# Patient Record
Sex: Male | Born: 1990 | Race: White | Hispanic: No | Marital: Single | Smoking: Current every day smoker
Health system: Southern US, Community
[De-identification: ages and names within clinical notes are randomized; demographics above are authoritative.]

## PROBLEM LIST (undated history)

## (undated) DIAGNOSIS — Z5189 Encounter for other specified aftercare: Secondary | ICD-10-CM

## (undated) DIAGNOSIS — D689 Coagulation defect, unspecified: Secondary | ICD-10-CM

## (undated) DIAGNOSIS — F419 Anxiety disorder, unspecified: Secondary | ICD-10-CM

## (undated) DIAGNOSIS — F32A Depression, unspecified: Secondary | ICD-10-CM

## (undated) DIAGNOSIS — F329 Major depressive disorder, single episode, unspecified: Secondary | ICD-10-CM

## (undated) DIAGNOSIS — W3400XA Accidental discharge from unspecified firearms or gun, initial encounter: Secondary | ICD-10-CM

## (undated) DIAGNOSIS — R109 Unspecified abdominal pain: Secondary | ICD-10-CM

## (undated) DIAGNOSIS — R634 Abnormal weight loss: Secondary | ICD-10-CM

## (undated) DIAGNOSIS — F913 Oppositional defiant disorder: Secondary | ICD-10-CM

## (undated) DIAGNOSIS — M79606 Pain in leg, unspecified: Secondary | ICD-10-CM

## (undated) HISTORY — DX: Unspecified abdominal pain: R10.9

## (undated) HISTORY — DX: Coagulation defect, unspecified: D68.9

## (undated) HISTORY — PX: HERNIA REPAIR: SHX51

## (undated) HISTORY — DX: Abnormal weight loss: R63.4

## (undated) HISTORY — DX: Pain in leg, unspecified: M79.606

## (undated) HISTORY — PX: COLOSTOMY REVERSAL: SHX5782

## (undated) HISTORY — PX: ABDOMINAL SURGERY: SHX537

## (undated) HISTORY — PX: COLOSTOMY: SHX63

## (undated) HISTORY — DX: Encounter for other specified aftercare: Z51.89

---
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
## 2011-04-13 LAB — CBC
MCHC: 35.5
MCV: 90
Platelets: 234
RDW: 13.9

## 2011-04-13 LAB — COMPREHENSIVE METABOLIC PANEL
AST: 34
Albumin: 4.7
CO2: 25
Calcium: 9.3
Creatinine, Ser: 0.97
GFR calc Af Amer: >60
GFR calc non Af Amer: >60
Total Protein: 7.1

## 2011-04-13 LAB — LIPASE, BLOOD: Lipase: 14

## 2011-04-13 LAB — DIFFERENTIAL
Eosinophils Relative: 0
Lymphocytes Relative: 9 — ABNORMAL LOW
Lymphs Abs: 0.9
Monocytes Relative: 5

## 2012-03-17 DIAGNOSIS — W3400XA Accidental discharge from unspecified firearms or gun, initial encounter: Secondary | ICD-10-CM

## 2012-03-17 HISTORY — DX: Accidental discharge from unspecified firearms or gun, initial encounter: W34.00XA

## 2012-03-22 ENCOUNTER — Encounter (HOSPITAL_COMMUNITY): Payer: Self-pay | Admitting: Certified Registered Nurse Anesthetist

## 2012-03-22 ENCOUNTER — Inpatient Hospital Stay (HOSPITAL_COMMUNITY): Payer: BC Managed Care – PPO

## 2012-03-22 ENCOUNTER — Emergency Department (HOSPITAL_COMMUNITY): Payer: BC Managed Care – PPO

## 2012-03-22 ENCOUNTER — Inpatient Hospital Stay (HOSPITAL_COMMUNITY)
Admission: EM | Admit: 2012-03-22 | Discharge: 2012-03-26 | DRG: 585 | Payer: BC Managed Care – PPO | Attending: General Surgery | Admitting: General Surgery

## 2012-03-22 ENCOUNTER — Encounter (HOSPITAL_COMMUNITY)
Admission: EM | Disposition: A | Discharge status: Other Acute Inpatient Hospital | Payer: Self-pay | Source: Home / Self Care

## 2012-03-22 ENCOUNTER — Emergency Department (HOSPITAL_COMMUNITY): Payer: BC Managed Care – PPO | Admitting: Certified Registered Nurse Anesthetist

## 2012-03-22 DIAGNOSIS — S36499A Other injury of unspecified part of small intestine, initial encounter: Secondary | ICD-10-CM | POA: Diagnosis present

## 2012-03-22 DIAGNOSIS — Y230XXA Shotgun discharge, undetermined intent, initial encounter: Secondary | ICD-10-CM

## 2012-03-22 DIAGNOSIS — R571 Hypovolemic shock: Secondary | ICD-10-CM | POA: Diagnosis not present

## 2012-03-22 DIAGNOSIS — S35513A Injury of unspecified iliac artery, initial encounter: Secondary | ICD-10-CM

## 2012-03-22 DIAGNOSIS — J9600 Acute respiratory failure, unspecified whether with hypoxia or hypercapnia: Secondary | ICD-10-CM | POA: Diagnosis present

## 2012-03-22 DIAGNOSIS — R509 Fever, unspecified: Secondary | ICD-10-CM | POA: Diagnosis not present

## 2012-03-22 DIAGNOSIS — S36509A Unspecified injury of unspecified part of colon, initial encounter: Secondary | ICD-10-CM

## 2012-03-22 DIAGNOSIS — Z885 Allergy status to narcotic agent status: Secondary | ICD-10-CM

## 2012-03-22 DIAGNOSIS — D684 Acquired coagulation factor deficiency: Secondary | ICD-10-CM | POA: Diagnosis present

## 2012-03-22 DIAGNOSIS — S35516A Injury of unspecified iliac vein, initial encounter: Secondary | ICD-10-CM

## 2012-03-22 DIAGNOSIS — D689 Coagulation defect, unspecified: Secondary | ICD-10-CM | POA: Diagnosis not present

## 2012-03-22 DIAGNOSIS — S36408A Unspecified injury of other part of small intestine, initial encounter: Secondary | ICD-10-CM | POA: Diagnosis present

## 2012-03-22 DIAGNOSIS — Y92009 Unspecified place in unspecified non-institutional (private) residence as the place of occurrence of the external cause: Secondary | ICD-10-CM

## 2012-03-22 DIAGNOSIS — S71109A Unspecified open wound, unspecified thigh, initial encounter: Secondary | ICD-10-CM | POA: Diagnosis present

## 2012-03-22 DIAGNOSIS — S71009A Unspecified open wound, unspecified hip, initial encounter: Secondary | ICD-10-CM | POA: Diagnosis present

## 2012-03-22 DIAGNOSIS — S36409A Unspecified injury of unspecified part of small intestine, initial encounter: Secondary | ICD-10-CM

## 2012-03-22 DIAGNOSIS — S3130XA Unspecified open wound of scrotum and testes, initial encounter: Secondary | ICD-10-CM | POA: Diagnosis present

## 2012-03-22 DIAGNOSIS — S31139A Puncture wound of abdominal wall without foreign body, unspecified quadrant without penetration into peritoneal cavity, initial encounter: Secondary | ICD-10-CM

## 2012-03-22 DIAGNOSIS — T85898A Other specified complication of other internal prosthetic devices, implants and grafts, initial encounter: Secondary | ICD-10-CM | POA: Diagnosis not present

## 2012-03-22 DIAGNOSIS — D696 Thrombocytopenia, unspecified: Secondary | ICD-10-CM | POA: Diagnosis not present

## 2012-03-22 DIAGNOSIS — T794XXA Traumatic shock, initial encounter: Secondary | ICD-10-CM | POA: Diagnosis present

## 2012-03-22 DIAGNOSIS — S31109A Unspecified open wound of abdominal wall, unspecified quadrant without penetration into peritoneal cavity, initial encounter: Secondary | ICD-10-CM

## 2012-03-22 DIAGNOSIS — S61509A Unspecified open wound of unspecified wrist, initial encounter: Secondary | ICD-10-CM | POA: Diagnosis present

## 2012-03-22 DIAGNOSIS — Z781 Physical restraint status: Secondary | ICD-10-CM | POA: Diagnosis not present

## 2012-03-22 DIAGNOSIS — S6992XA Unspecified injury of left wrist, hand and finger(s), initial encounter: Secondary | ICD-10-CM | POA: Diagnosis present

## 2012-03-22 DIAGNOSIS — D62 Acute posthemorrhagic anemia: Secondary | ICD-10-CM | POA: Diagnosis present

## 2012-03-22 DIAGNOSIS — S36899A Unspecified injury of other intra-abdominal organs, initial encounter: Principal | ICD-10-CM | POA: Diagnosis present

## 2012-03-22 DIAGNOSIS — Y833 Surgical operation with formation of external stoma as the cause of abnormal reaction of the patient, or of later complication, without mention of misadventure at the time of the procedure: Secondary | ICD-10-CM | POA: Diagnosis not present

## 2012-03-22 HISTORY — PX: APPLICATION OF WOUND VAC: SHX5189

## 2012-03-22 HISTORY — PX: BOWEL RESECTION: SHX1257

## 2012-03-22 HISTORY — PX: AORTA - BILATERAL FEMORAL ARTERY BYPASS GRAFT: SHX1175

## 2012-03-22 HISTORY — PX: LAPAROTOMY: SHX154

## 2012-03-22 HISTORY — PX: PARTIAL COLECTOMY: SHX5273

## 2012-03-22 LAB — POCT I-STAT 4, (NA,K, GLUC, HGB,HCT)
Hemoglobin: 10.5 g/dL — ABNORMAL LOW (ref 13.0–17.0)
Potassium: 4.4 mEq/L (ref 3.5–5.1)
Potassium: 3.8 mEq/L (ref 3.5–5.1)
Sodium: 144 mEq/L (ref 135–145)
Sodium: 144 mEq/L (ref 135–145)

## 2012-03-22 LAB — POCT I-STAT 3, ART BLOOD GAS (G3+)
Patient temperature: 98.6
pH, Arterial: 7.317 — ABNORMAL LOW (ref 7.350–7.450)

## 2012-03-22 LAB — POCT I-STAT, CHEM 8
Chloride: 107 mEq/L (ref 96–112)
Glucose, Bld: 273 mg/dL — ABNORMAL HIGH (ref 70–99)
HCT: 37 % — ABNORMAL LOW (ref 39.0–52.0)
Potassium: 3.1 mEq/L — ABNORMAL LOW (ref 3.5–5.1)

## 2012-03-22 LAB — URINALYSIS, MICROSCOPIC ONLY
Bilirubin Urine: NEGATIVE
Ketones, ur: NEGATIVE mg/dL
Nitrite: NEGATIVE
Protein, ur: 30 mg/dL — AB
Urobilinogen, UA: 0.2 mg/dL (ref 0.0–1.0)

## 2012-03-22 LAB — CBC
HCT: 35 % — ABNORMAL LOW (ref 39.0–52.0)
HCT: 34 % — ABNORMAL LOW (ref 39.0–52.0)
HCT: 27.2 % — ABNORMAL LOW (ref 39.0–52.0)
HCT: 23.4 % — ABNORMAL LOW (ref 39.0–52.0)
Hemoglobin: 11.9 g/dL — ABNORMAL LOW (ref 13.0–17.0)
Hemoglobin: 9.7 g/dL — ABNORMAL LOW (ref 13.0–17.0)
Hemoglobin: 8.4 g/dL — ABNORMAL LOW (ref 13.0–17.0)
MCH: 30.2 pg (ref 26.0–34.0)
MCH: 31 pg (ref 26.0–34.0)
MCHC: 35 g/dL (ref 30.0–36.0)
MCHC: 35.7 g/dL (ref 30.0–36.0)
MCV: 91.9 fL (ref 78.0–100.0)
RBC: 3.16 MIL/uL — ABNORMAL LOW (ref 4.22–5.81)
RBC: 2.71 MIL/uL — ABNORMAL LOW (ref 4.22–5.81)
RDW: 13.4 % (ref 11.5–15.5)
RDW: 14.4 % (ref 11.5–15.5)
WBC: 19 10*3/uL — ABNORMAL HIGH (ref 4.0–10.5)
WBC: 4.1 10*3/uL (ref 4.0–10.5)

## 2012-03-22 LAB — COMPREHENSIVE METABOLIC PANEL
ALT: 21 U/L (ref 0–53)
AST: 32 U/L (ref 0–37)
Albumin: 2.7 g/dL — ABNORMAL LOW (ref 3.5–5.2)
Albumin: 3 g/dL — ABNORMAL LOW (ref 3.5–5.2)
Alkaline Phosphatase: 46 U/L (ref 39–117)
Alkaline Phosphatase: 46 U/L (ref 39–117)
BUN: 15 mg/dL (ref 6–23)
BUN: 17 mg/dL (ref 6–23)
Calcium: 7.5 mg/dL — ABNORMAL LOW (ref 8.4–10.5)
Creatinine, Ser: 0.88 mg/dL (ref 0.50–1.35)
GFR calc Af Amer: >90 mL/min (ref >90)
Glucose, Bld: 118 mg/dL — ABNORMAL HIGH (ref 70–99)
Potassium: 3.7 mEq/L (ref 3.5–5.1)
Sodium: 139 mEq/L (ref 135–145)
Total Protein: 4.5 g/dL — ABNORMAL LOW (ref 6.0–8.3)
Total Protein: 4.7 g/dL — ABNORMAL LOW (ref 6.0–8.3)

## 2012-03-22 LAB — BASIC METABOLIC PANEL
CO2: 26 mEq/L (ref 19–32)
Chloride: 109 mEq/L (ref 96–112)
GFR calc non Af Amer: >90 mL/min (ref >90)
Glucose, Bld: 90 mg/dL (ref 70–99)
Potassium: 3.6 mEq/L (ref 3.5–5.1)
Sodium: 143 mEq/L (ref 135–145)

## 2012-03-22 LAB — PROTIME-INR
INR: 1.47 (ref 0.00–1.49)
INR: 1.49 (ref 0.00–1.49)

## 2012-03-22 LAB — MRSA PCR SCREENING: MRSA by PCR: NEGATIVE

## 2012-03-22 LAB — ABO/RH: ABO/RH(D): O POS

## 2012-03-22 LAB — POCT I-STAT TROPONIN I

## 2012-03-22 SURGERY — LAPAROTOMY, EXPLORATORY
Anesthesia: General | Site: Abdomen | Laterality: Right | Wound class: Dirty or Infected

## 2012-03-22 MED ORDER — SODIUM CHLORIDE 0.9 % IV SOLN
2.0000 mg/h | INTRAVENOUS | Status: DC
  Administered 2012-03-22 – 2012-03-23 (×2): 8 mg/h via INTRAVENOUS
  Administered 2012-03-24: 9 mg/h via INTRAVENOUS
  Administered 2012-03-25 (×3): 10 mg/h via INTRAVENOUS
  Filled 2012-03-22 (×8): qty 20

## 2012-03-22 MED ORDER — CEFAZOLIN SODIUM 1-5 GM-% IV SOLN
INTRAVENOUS | Status: AC
  Filled 2012-03-22: qty 100

## 2012-03-22 MED ORDER — PANTOPRAZOLE SODIUM 40 MG IV SOLR
40.0000 mg | Freq: Every day | INTRAVENOUS | Status: DC
  Administered 2012-03-22 – 2012-03-26 (×5): 40 mg via INTRAVENOUS
  Filled 2012-03-22 (×7): qty 40

## 2012-03-22 MED ORDER — CEFAZOLIN SODIUM 1-5 GM-% IV SOLN
INTRAVENOUS | Status: DC | PRN
  Administered 2012-03-22: 2 g via INTRAVENOUS

## 2012-03-22 MED ORDER — MIDAZOLAM BOLUS VIA INFUSION
1.0000 mg | INTRAVENOUS | Status: DC | PRN
  Filled 2012-03-22: qty 2

## 2012-03-22 MED ORDER — MIDAZOLAM HCL 5 MG/5ML IJ SOLN
INTRAMUSCULAR | Status: DC | PRN
  Administered 2012-03-22: 2 mg via INTRAVENOUS

## 2012-03-22 MED ORDER — ETOMIDATE 2 MG/ML IV SOLN
INTRAVENOUS | Status: AC | PRN
  Administered 2012-03-22: 20 mg via INTRAVENOUS

## 2012-03-22 MED ORDER — ALBUMIN HUMAN 5 % IV SOLN
INTRAVENOUS | Status: DC | PRN
  Administered 2012-03-22 (×3): via INTRAVENOUS

## 2012-03-22 MED ORDER — LACTATED RINGERS IV SOLN
INTRAVENOUS | Status: DC | PRN
  Administered 2012-03-22: 04:00:00 via INTRAVENOUS

## 2012-03-22 MED ORDER — LACTATED RINGERS IV BOLUS (SEPSIS)
1000.0000 mL | Freq: Once | INTRAVENOUS | Status: AC
  Administered 2012-03-22: 1000 mL via INTRAVENOUS

## 2012-03-22 MED ORDER — PHENYLEPHRINE HCL 10 MG/ML IJ SOLN
INTRAMUSCULAR | Status: DC | PRN
  Administered 2012-03-22 (×5): 80 ug via INTRAVENOUS

## 2012-03-22 MED ORDER — ALBUMIN HUMAN 5 % IV SOLN
INTRAVENOUS | Status: AC
  Filled 2012-03-22: qty 500

## 2012-03-22 MED ORDER — DEXTROSE IN LACTATED RINGERS 5 % IV SOLN
INTRAVENOUS | Status: DC
  Administered 2012-03-22: 125 mL via INTRAVENOUS

## 2012-03-22 MED ORDER — FENTANYL BOLUS VIA INFUSION
50.0000 ug | Freq: Four times a day (QID) | INTRAVENOUS | Status: DC | PRN
  Filled 2012-03-22 (×2): qty 100

## 2012-03-22 MED ORDER — SODIUM CHLORIDE 0.9 % IV SOLN
2.0000 mg/h | INTRAVENOUS | Status: DC
  Administered 2012-03-22: 2 mg/h via INTRAVENOUS
  Filled 2012-03-22: qty 10

## 2012-03-22 MED ORDER — CHLORHEXIDINE GLUCONATE 0.12 % MT SOLN
15.0000 mL | Freq: Two times a day (BID) | OROMUCOSAL | Status: DC
  Administered 2012-03-22: 22:00:00 via OROMUCOSAL
  Administered 2012-03-23 – 2012-03-26 (×7): 15 mL via OROMUCOSAL
  Filled 2012-03-22 (×7): qty 15

## 2012-03-22 MED ORDER — INDIGOTINDISULFONATE SODIUM 8 MG/ML IJ SOLN
INTRAMUSCULAR | Status: AC
  Filled 2012-03-22: qty 5

## 2012-03-22 MED ORDER — ALBUMIN HUMAN 5 % IV SOLN
12.5000 g | Freq: Once | INTRAVENOUS | Status: AC
  Administered 2012-03-22: 12.5 g via INTRAVENOUS

## 2012-03-22 MED ORDER — SODIUM CHLORIDE 0.9 % IR SOLN
Status: DC | PRN
  Administered 2012-03-22: 05:00:00

## 2012-03-22 MED ORDER — BIOTENE DRY MOUTH MT LIQD
15.0000 mL | OROMUCOSAL | Status: DC
  Administered 2012-03-23 – 2012-03-26 (×19): 15 mL via OROMUCOSAL

## 2012-03-22 MED ORDER — ONDANSETRON HCL 4 MG/2ML IJ SOLN
4.0000 mg | Freq: Four times a day (QID) | INTRAMUSCULAR | Status: DC | PRN
  Filled 2012-03-22: qty 2

## 2012-03-22 MED ORDER — PANTOPRAZOLE SODIUM 40 MG PO TBEC
40.0000 mg | DELAYED_RELEASE_TABLET | Freq: Every day | ORAL | Status: DC
  Filled 2012-03-22: qty 1

## 2012-03-22 MED ORDER — ROCURONIUM BROMIDE 100 MG/10ML IV SOLN
INTRAVENOUS | Status: DC | PRN
  Administered 2012-03-22 (×3): 50 mg via INTRAVENOUS

## 2012-03-22 MED ORDER — FENTANYL CITRATE 0.05 MG/ML IJ SOLN
INTRAMUSCULAR | Status: DC | PRN
  Administered 2012-03-22 (×2): 100 ug via INTRAVENOUS
  Administered 2012-03-22: 50 ug via INTRAVENOUS
  Administered 2012-03-22: 75 ug via INTRAVENOUS
  Administered 2012-03-22 (×2): 50 ug via INTRAVENOUS
  Administered 2012-03-22: 25 ug via INTRAVENOUS
  Administered 2012-03-22: 50 ug via INTRAVENOUS

## 2012-03-22 MED ORDER — MIDAZOLAM HCL 2 MG/2ML IJ SOLN
INTRAMUSCULAR | Status: AC
  Filled 2012-03-22: qty 10

## 2012-03-22 MED ORDER — ONDANSETRON HCL 4 MG PO TABS
4.0000 mg | ORAL_TABLET | Freq: Four times a day (QID) | ORAL | Status: DC | PRN
  Filled 2012-03-22: qty 1

## 2012-03-22 MED ORDER — SODIUM CHLORIDE 0.9 % IV SOLN
50.0000 ug/h | INTRAVENOUS | Status: DC
  Administered 2012-03-22: 400 ug/h via INTRAVENOUS
  Administered 2012-03-22: 100 ug/h via INTRAVENOUS
  Administered 2012-03-23: 350 ug/h via INTRAVENOUS
  Administered 2012-03-24: 375 ug/h via INTRAVENOUS
  Administered 2012-03-24 – 2012-03-25 (×4): 400 ug/h via INTRAVENOUS
  Filled 2012-03-22 (×12): qty 50

## 2012-03-22 MED ORDER — VECURONIUM BROMIDE 10 MG IV SOLR
INTRAVENOUS | Status: DC | PRN
  Administered 2012-03-22: 5 mg via INTRAVENOUS

## 2012-03-22 MED ORDER — SODIUM CHLORIDE 0.9 % IV BOLUS (SEPSIS)
1000.0000 mL | Freq: Once | INTRAVENOUS | Status: AC
  Administered 2012-03-22: 1000 mL via INTRAVENOUS

## 2012-03-22 MED ORDER — CALCIUM CHLORIDE 10 % IV SOLN
INTRAVENOUS | Status: DC | PRN
  Administered 2012-03-22: 300 mg via INTRAVENOUS

## 2012-03-22 MED ORDER — SODIUM CHLORIDE 0.9 % IV SOLN
INTRAVENOUS | Status: DC | PRN
  Administered 2012-03-22 (×2): via INTRAVENOUS

## 2012-03-22 MED ORDER — METHYLENE BLUE 1 % INJ SOLN
INTRAMUSCULAR | Status: DC | PRN
  Administered 2012-03-22: 8 mL via INTRAVENOUS

## 2012-03-22 MED ORDER — SUCCINYLCHOLINE CHLORIDE 20 MG/ML IJ SOLN
INTRAMUSCULAR | Status: AC | PRN
  Administered 2012-03-22: 120 mg via INTRAVENOUS

## 2012-03-22 MED ORDER — CHLORHEXIDINE GLUCONATE 0.12 % MT SOLN
OROMUCOSAL | Status: AC
  Filled 2012-03-22: qty 15

## 2012-03-22 SURGICAL SUPPLY — 65 items
BANDAGE GAUZE ELAST BULKY 4 IN (GAUZE/BANDAGES/DRESSINGS) ×3 IMPLANT
BENZOIN TINCTURE PRP APPL 2/3 (GAUZE/BANDAGES/DRESSINGS) ×6 IMPLANT
BLADE SURG ROTATE 9660 (MISCELLANEOUS) IMPLANT
BOOT SUTURE AID YELLOW STND (SUTURE) ×3 IMPLANT
CANISTER SUCTION 2500CC (MISCELLANEOUS) ×3 IMPLANT
CATH EMB 3FR 80CM (CATHETERS) ×3 IMPLANT
CATH EMB 4FR 80CM (CATHETERS) ×3 IMPLANT
CHLORAPREP W/TINT 26ML (MISCELLANEOUS) IMPLANT
CLIP TI WIDE RED SMALL 24 (CLIP) ×3 IMPLANT
CLOTH BEACON ORANGE TIMEOUT ST (SAFETY) ×3 IMPLANT
COVER SURGICAL LIGHT HANDLE (MISCELLANEOUS) ×3 IMPLANT
DRAPE LAPAROSCOPIC ABDOMINAL (DRAPES) ×3 IMPLANT
DRAPE UTILITY 15X26 W/TAPE STR (DRAPE) ×6 IMPLANT
DRAPE WARM FLUID 44X44 (DRAPE) ×3 IMPLANT
DRSG VAC ATS MED SENSATRAC (GAUZE/BANDAGES/DRESSINGS) ×3 IMPLANT
DRSG VERSA FOAM LRG 10X15 (GAUZE/BANDAGES/DRESSINGS) ×3 IMPLANT
ELECT BLADE 6.5 EXT (BLADE) ×3 IMPLANT
ELECT REM PT RETURN 9FT ADLT (ELECTROSURGICAL) ×3
ELECTRODE REM PT RTRN 9FT ADLT (ELECTROSURGICAL) ×2 IMPLANT
GLOVE BIO SURGEON STRL SZ8 (GLOVE) ×15 IMPLANT
GLOVE BIOGEL PI IND STRL 6.5 (GLOVE) ×2 IMPLANT
GLOVE BIOGEL PI IND STRL 7.5 (GLOVE) ×2 IMPLANT
GLOVE BIOGEL PI IND STRL 8 (GLOVE) ×6 IMPLANT
GLOVE BIOGEL PI INDICATOR 6.5 (GLOVE) ×1
GLOVE BIOGEL PI INDICATOR 7.5 (GLOVE) ×1
GLOVE BIOGEL PI INDICATOR 8 (GLOVE) ×3
GLOVE SURG SS PI 7.5 STRL IVOR (GLOVE) ×3 IMPLANT
GOWN PREVENTION PLUS XLARGE (GOWN DISPOSABLE) ×3 IMPLANT
GOWN STRL NON-REIN LRG LVL3 (GOWN DISPOSABLE) ×15 IMPLANT
KIT BASIN OR (CUSTOM PROCEDURE TRAY) ×3 IMPLANT
KIT ROOM TURNOVER OR (KITS) ×3 IMPLANT
LIGASURE IMPACT 36 18CM CVD LR (INSTRUMENTS) IMPLANT
LOOP VESSEL MAXI BLUE (MISCELLANEOUS) ×9 IMPLANT
LOOP VESSEL MINI RED (MISCELLANEOUS) ×6 IMPLANT
NS IRRIG 1000ML POUR BTL (IV SOLUTION) ×6 IMPLANT
PACK GENERAL/GYN (CUSTOM PROCEDURE TRAY) ×3 IMPLANT
PAD ARMBOARD 7.5X6 YLW CONV (MISCELLANEOUS) ×6 IMPLANT
RELOAD PROXIMATE 75MM BLUE (ENDOMECHANICALS) ×9 IMPLANT
SPECIMEN JAR X LARGE (MISCELLANEOUS) IMPLANT
SPONGE ABDOMINAL VAC ABTHERA (MISCELLANEOUS) ×3 IMPLANT
SPONGE GAUZE 4X4 12PLY (GAUZE/BANDAGES/DRESSINGS) ×3 IMPLANT
SPONGE LAP 18X18 X RAY DECT (DISPOSABLE) ×21 IMPLANT
STAPLER PROXIMATE 75MM BLUE (STAPLE) ×3 IMPLANT
STAPLER VISISTAT 35W (STAPLE) ×3 IMPLANT
SUCTION POOLE TIP (SUCTIONS) ×3 IMPLANT
SUT PDS AB 1 TP1 96 (SUTURE) ×6 IMPLANT
SUT PROLENE 5 0 C 1 36 (SUTURE) ×21 IMPLANT
SUT PROLENE 6 0 BV (SUTURE) ×3 IMPLANT
SUT PROLENE 6 0 C 1 30 (SUTURE) ×9 IMPLANT
SUT SILK 2 0 SH CR/8 (SUTURE) ×3 IMPLANT
SUT SILK 2 0 TIES 10X30 (SUTURE) ×3 IMPLANT
SUT SILK 3 0 SH CR/8 (SUTURE) ×3 IMPLANT
SUT SILK 3 0 TIES 10X30 (SUTURE) ×3 IMPLANT
SUT VIC AB 2-0 CT1 27 (SUTURE) ×2
SUT VIC AB 2-0 CT1 TAPERPNT 27 (SUTURE) ×4 IMPLANT
SUT VIC AB 3-0 SH 27 (SUTURE) ×1
SUT VIC AB 3-0 SH 27XBRD (SUTURE) ×2 IMPLANT
SUT VICRYL 4-0 PS2 18IN ABS (SUTURE) ×3 IMPLANT
SYR 20CC LL (SYRINGE) ×3 IMPLANT
TAPE CLOTH 1X10 TAN NS (GAUZE/BANDAGES/DRESSINGS) ×3 IMPLANT
TOWEL OR 17X24 6PK STRL BLUE (TOWEL DISPOSABLE) ×12 IMPLANT
TOWEL OR 17X26 10 PK STRL BLUE (TOWEL DISPOSABLE) ×6 IMPLANT
TRAY FOLEY CATH 14FRSI W/METER (CATHETERS) ×3 IMPLANT
WATER STERILE IRR 1000ML POUR (IV SOLUTION) ×3 IMPLANT
YANKAUER SUCT BULB TIP NO VENT (SUCTIONS) IMPLANT

## 2012-03-22 NOTE — ED Notes (Signed)
First unit infused.  2 unit o neg blood infusing.
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
## 2012-03-22 NOTE — ED Notes (Signed)
O- blood infusing with rapid infuser
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
## 2012-03-22 NOTE — Progress Notes (Signed)
UR complete 
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
## 2012-03-22 NOTE — Anesthesia Preprocedure Evaluation (Signed)
Anesthesia Evaluation  General Assessment Comment:Ros unobtainable  Airway       Dental   Pulmonary          Cardiovascular Rate:Tachycardia     Neuro/Psych    GI/Hepatic   Endo/Other    Renal/GU      Musculoskeletal   Abdominal   Peds  Hematology   Anesthesia Other Findings   Reproductive/Obstetrics                           Anesthesia Physical Anesthesia Plan  ASA: IV and Emergent  Anesthesia Plan:    Post-op Pain Management:    Induction: Inhalational  Airway Management Planned: Oral ETT  Additional Equipment: CVP  Intra-op Plan:   Post-operative Plan: Post-operative intubation/ventilation  Informed Consent:   Plan Discussed with: CRNA and Surgeon  Anesthesia Plan Comments:         Anesthesia Quick Evaluation

## 2012-03-22 NOTE — Progress Notes (Signed)
VASCULAR POSTOP  NOTE  SUBJECTIVE: Sedated on vent.   PHYSICAL EXAM: Filed Vitals:   03/22/12 1315 03/22/12 1330 03/22/12 1345 03/22/12 1400  BP: 98/54 91/35 94/50  99/43  Pulse: 125 120 123 121  Temp: 101.7 F (38.7 C) 101.7 F (38.7 C) 101.5 F (38.6 C) 101.8 F (38.8 C)  TempSrc:      Resp: 19 18 19 19   SpO2: 100% 100% 100% 100%   Biphasic PT signal with doppler on right.  Brisk DP signal. Right calf soft.  LABS: Lab Results  Component Value Date   WBC 4.1 03/22/2012   HGB 9.7* 03/22/2012   HCT 27.2* 03/22/2012   MCV 86.1 03/22/2012   PLT PENDING 03/22/2012   Lab Results  Component Value Date   CREATININE 0.88 03/22/2012   Lab Results  Component Value Date   INR 1.47 03/22/2012   CBG (last 3)  No results found for this basename: GLUCAP:3 in the last 72 hours   ASSESSMENT/PLAN: 1. Stable postop  Waverly Ferrari, MD, FACS Beeper: [phone] 03/22/2012
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
## 2012-03-22 NOTE — Progress Notes (Signed)
Injuries, potential further treatments and condition were discussed with his mother and his father.

## 2012-03-23 ENCOUNTER — Inpatient Hospital Stay (HOSPITAL_COMMUNITY): Payer: BC Managed Care – PPO

## 2012-03-23 DIAGNOSIS — D689 Coagulation defect, unspecified: Secondary | ICD-10-CM

## 2012-03-23 DIAGNOSIS — J95821 Acute postprocedural respiratory failure: Secondary | ICD-10-CM

## 2012-03-23 DIAGNOSIS — D62 Acute posthemorrhagic anemia: Secondary | ICD-10-CM

## 2012-03-23 LAB — PREPARE FRESH FROZEN PLASMA
Unit division: 0
Unit division: 0
Unit division: 0
Unit division: 0

## 2012-03-23 LAB — BASIC METABOLIC PANEL
BUN: 12 mg/dL (ref 6–23)
CO2: 26 mEq/L (ref 19–32)
Calcium: 6.9 mg/dL — ABNORMAL LOW (ref 8.4–10.5)
Creatinine, Ser: 0.94 mg/dL (ref 0.50–1.35)
Glucose, Bld: 107 mg/dL — ABNORMAL HIGH (ref 70–99)
Sodium: 143 mEq/L (ref 135–145)

## 2012-03-23 LAB — BLOOD GAS, ARTERIAL
Acid-Base Excess: 0.9 mmol/L (ref 0.0–2.0)
Bicarbonate: 24.7 meq/L — ABNORMAL HIGH (ref 20.0–24.0)
Drawn by: 1552713
FIO2: 40 %
MECHVT: 550 mL
O2 Saturation: 99.5 %
PEEP: 5 cmH2O
Patient temperature: 100.4
RATE: 18 {breaths}/min
TCO2: 25.8 mmol/L (ref 0–100)
pCO2 arterial: 39 mmHg (ref 35.0–45.0)
pH, Arterial: 7.422 (ref 7.350–7.450)
pO2, Arterial: 110 mmHg — ABNORMAL HIGH (ref 80.0–100.0)

## 2012-03-23 LAB — PREPARE PLATELET PHERESIS: Unit division: 0

## 2012-03-23 LAB — CBC
HCT: 28.5 % — ABNORMAL LOW (ref 39.0–52.0)
Hemoglobin: 10.1 g/dL — ABNORMAL LOW (ref 13.0–17.0)
MCH: 30.7 pg (ref 26.0–34.0)
MCHC: 35.4 g/dL (ref 30.0–36.0)
MCV: 86.6 fL (ref 78.0–100.0)
RBC: 3.29 MIL/uL — ABNORMAL LOW (ref 4.22–5.81)

## 2012-03-23 LAB — PROTIME-INR: INR: 1.74 — ABNORMAL HIGH (ref 0.00–1.49)

## 2012-03-23 MED ORDER — CEFAZOLIN SODIUM-DEXTROSE 2-3 GM-% IV SOLR
2.0000 g | INTRAVENOUS | Status: DC
  Filled 2012-03-23: qty 50

## 2012-03-23 MED ORDER — SODIUM CHLORIDE 0.9 % IV SOLN
2.0000 g | Freq: Once | INTRAVENOUS | Status: AC
  Administered 2012-03-23: 2 g via INTRAVENOUS
  Filled 2012-03-23: qty 20

## 2012-03-23 MED ORDER — ACETAMINOPHEN 650 MG RE SUPP
650.0000 mg | Freq: Four times a day (QID) | RECTAL | Status: DC | PRN
  Administered 2012-03-24: 650 mg via RECTAL
  Filled 2012-03-23: qty 1

## 2012-03-23 MED ORDER — SODIUM CHLORIDE 0.9 % IV SOLN
3.0000 g | INTRAVENOUS | Status: AC
  Administered 2012-03-24: 3 g via INTRAVENOUS
  Filled 2012-03-23: qty 3
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
## 2012-03-23 NOTE — Progress Notes (Signed)
Patient ID: [name], male   DOB: 12-06-1990, 21 y.o.   MRN: [id number] Patient sedated on the ventilator Right lower extremity with 3+ popliteal pulse palpable and excellent flow in DP and PT-right foot well-perfused VAC in place in right lower quadrant inguinal area   Stable from vascular surgery standpoint
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
## 2012-03-24 ENCOUNTER — Inpatient Hospital Stay (HOSPITAL_COMMUNITY): Payer: BC Managed Care – PPO

## 2012-03-24 ENCOUNTER — Encounter (HOSPITAL_COMMUNITY)
Admission: EM | Disposition: A | Discharge status: Other Acute Inpatient Hospital | Payer: Self-pay | Source: Home / Self Care

## 2012-03-24 ENCOUNTER — Encounter (HOSPITAL_COMMUNITY): Payer: Self-pay | Admitting: Certified Registered"

## 2012-03-24 ENCOUNTER — Inpatient Hospital Stay (HOSPITAL_COMMUNITY): Payer: BC Managed Care – PPO | Admitting: Certified Registered"

## 2012-03-24 HISTORY — PX: LAPAROTOMY: SHX154

## 2012-03-24 HISTORY — PX: I&D EXTREMITY: SHX5045

## 2012-03-24 LAB — TYPE AND SCREEN
Unit division: 0
Unit division: 0
Unit division: 0
Unit division: 0
Unit division: 0
Unit division: 0
Unit division: 0
Unit division: 0
Unit division: 0
Unit division: 0

## 2012-03-24 LAB — CBC
HCT: 24 % — ABNORMAL LOW (ref 39.0–52.0)
MCH: 30.8 pg (ref 26.0–34.0)
MCHC: 35 g/dL (ref 30.0–36.0)
MCV: 87.9 fL (ref 78.0–100.0)
RDW: 14.2 % (ref 11.5–15.5)
WBC: 8.3 10*3/uL (ref 4.0–10.5)

## 2012-03-24 LAB — PREPARE FRESH FROZEN PLASMA: Unit division: 0

## 2012-03-24 LAB — BASIC METABOLIC PANEL
BUN: 7 mg/dL (ref 6–23)
Calcium: 8 mg/dL — ABNORMAL LOW (ref 8.4–10.5)
Chloride: 106 mEq/L (ref 96–112)
Creatinine, Ser: 0.89 mg/dL (ref 0.50–1.35)
GFR calc Af Amer: >90 mL/min (ref >90)

## 2012-03-24 LAB — PREPARE PLATELET PHERESIS

## 2012-03-24 LAB — CALCIUM, IONIZED: Calcium, Ion: 1.13 mmol/L (ref 1.12–1.32)

## 2012-03-24 SURGERY — LAPAROTOMY, EXPLORATORY
Anesthesia: General | Site: Wrist | Wound class: Contaminated

## 2012-03-24 MED ORDER — POTASSIUM CHLORIDE IN NACL 20-0.45 MEQ/L-% IV SOLN
INTRAVENOUS | Status: DC
  Administered 2012-03-25: 100 mL/h via INTRAVENOUS
  Administered 2012-03-26: 12:00:00 via INTRAVENOUS
  Filled 2012-03-24 (×8): qty 1000

## 2012-03-24 MED ORDER — POTASSIUM CHLORIDE 10 MEQ/50ML IV SOLN
INTRAVENOUS | Status: AC
  Administered 2012-03-24: 10 meq
  Filled 2012-03-24: qty 50

## 2012-03-24 MED ORDER — SODIUM CHLORIDE 0.9 % IR SOLN
Status: DC | PRN
  Administered 2012-03-24: 2000

## 2012-03-24 MED ORDER — ROCURONIUM BROMIDE 100 MG/10ML IV SOLN
INTRAVENOUS | Status: DC | PRN
  Administered 2012-03-24 (×2): 50 mg via INTRAVENOUS

## 2012-03-24 MED ORDER — POTASSIUM CHLORIDE IN NACL 20-0.45 MEQ/L-% IV SOLN
INTRAVENOUS | Status: DC
  Administered 2012-03-24: 75 mL via INTRAVENOUS
  Filled 2012-03-24: qty 1000

## 2012-03-24 MED ORDER — LACTATED RINGERS IV SOLN
INTRAVENOUS | Status: DC | PRN
  Administered 2012-03-24: 10:00:00 via INTRAVENOUS

## 2012-03-24 MED ORDER — ACETAMINOPHEN 650 MG RE SUPP
650.0000 mg | Freq: Four times a day (QID) | RECTAL | Status: DC | PRN
  Administered 2012-03-24 – 2012-03-26 (×2): 650 mg via RECTAL
  Filled 2012-03-24 (×4): qty 1

## 2012-03-24 MED ORDER — SODIUM CHLORIDE 0.9 % IV SOLN
INTRAVENOUS | Status: DC | PRN
  Administered 2012-03-24 (×2): via INTRAVENOUS

## 2012-03-24 MED ORDER — VECURONIUM BROMIDE 10 MG IV SOLR
INTRAVENOUS | Status: DC | PRN
  Administered 2012-03-24 (×2): 10 mg via INTRAVENOUS

## 2012-03-24 SURGICAL SUPPLY — 69 items
BANDAGE ELASTIC 3 VELCRO ST LF (GAUZE/BANDAGES/DRESSINGS) ×3 IMPLANT
BANDAGE GAUZE ELAST BULKY 4 IN (GAUZE/BANDAGES/DRESSINGS) ×3 IMPLANT
BLADE SURG 10 STRL SS (BLADE) ×3 IMPLANT
BLADE SURG 15 STRL LF DISP TIS (BLADE) ×2 IMPLANT
BLADE SURG 15 STRL SS (BLADE) ×1
BLADE SURG ROTATE 9660 (MISCELLANEOUS) ×3 IMPLANT
CANISTER SUCTION 2500CC (MISCELLANEOUS) ×6 IMPLANT
CHLORAPREP W/TINT 26ML (MISCELLANEOUS) ×3 IMPLANT
CLEANER TIP ELECTROSURG 2X2 (MISCELLANEOUS) IMPLANT
CLOTH BEACON ORANGE TIMEOUT ST (SAFETY) ×3 IMPLANT
COVER SURGICAL LIGHT HANDLE (MISCELLANEOUS) ×3 IMPLANT
DRAPE LAPAROSCOPIC ABDOMINAL (DRAPES) ×6 IMPLANT
DRAPE LAPAROTOMY TRNSV 102X78 (DRAPE) IMPLANT
DRAPE UTILITY 15X26 W/TAPE STR (DRAPE) ×6 IMPLANT
DRAPE WARM FLUID 44X44 (DRAPE) ×3 IMPLANT
DRSG EMULSION OIL 3X3 NADH (GAUZE/BANDAGES/DRESSINGS) ×3 IMPLANT
DRSG PAD ABDOMINAL 8X10 ST (GAUZE/BANDAGES/DRESSINGS) IMPLANT
DRSG VAC ATS LRG SENSATRAC (GAUZE/BANDAGES/DRESSINGS) ×3 IMPLANT
DRSG VAC ATS MED SENSATRAC (GAUZE/BANDAGES/DRESSINGS) ×3 IMPLANT
ELECT CAUTERY BLADE 6.4 (BLADE) ×3 IMPLANT
ELECT REM PT RETURN 9FT ADLT (ELECTROSURGICAL) ×3
ELECTRODE REM PT RTRN 9FT ADLT (ELECTROSURGICAL) ×2 IMPLANT
GAUZE SPONGE 4X4 16PLY XRAY LF (GAUZE/BANDAGES/DRESSINGS) IMPLANT
GLOVE BIO SURGEON STRL SZ7 (GLOVE) ×6 IMPLANT
GLOVE BIOGEL PI IND STRL 7.5 (GLOVE) ×2 IMPLANT
GLOVE BIOGEL PI INDICATOR 7.5 (GLOVE) ×1
GOWN STRL NON-REIN LRG LVL3 (GOWN DISPOSABLE) ×12 IMPLANT
HANDPIECE INTERPULSE COAX TIP (DISPOSABLE)
KIT BASIN OR (CUSTOM PROCEDURE TRAY) ×3 IMPLANT
KIT OSTOMY DRAINABLE 2.75 STR (WOUND CARE) ×3 IMPLANT
KIT ROOM TURNOVER OR (KITS) ×3 IMPLANT
LIGASURE IMPACT 36 18CM CVD LR (INSTRUMENTS) IMPLANT
NS IRRIG 1000ML POUR BTL (IV SOLUTION) ×3 IMPLANT
PACK GENERAL/GYN (CUSTOM PROCEDURE TRAY) ×3 IMPLANT
PACK SURGICAL SETUP 50X90 (CUSTOM PROCEDURE TRAY) ×3 IMPLANT
PAD ARMBOARD 7.5X6 YLW CONV (MISCELLANEOUS) ×6 IMPLANT
PAD NEG PRESSURE SENSATRAC (MISCELLANEOUS) ×3 IMPLANT
PENCIL BUTTON HOLSTER BLD 10FT (ELECTRODE) ×3 IMPLANT
SEPRAFILM PROCEDURAL PACK 3X5 (MISCELLANEOUS) ×3 IMPLANT
SET HNDPC FAN SPRY TIP SCT (DISPOSABLE) IMPLANT
SPECIMEN JAR LARGE (MISCELLANEOUS) IMPLANT
SPONGE GAUZE 4X4 12PLY (GAUZE/BANDAGES/DRESSINGS) ×3 IMPLANT
SPONGE LAP 18X18 X RAY DECT (DISPOSABLE) ×6 IMPLANT
STAPLER GUN LINEAR PROX 60 (STAPLE) ×3 IMPLANT
STAPLER PROXIMATE 75MM BLUE (STAPLE) ×3 IMPLANT
STAPLER VISISTAT 35W (STAPLE) ×3 IMPLANT
SUCTION POOLE TIP (SUCTIONS) IMPLANT
SUT MNCRL AB 4-0 PS2 18 (SUTURE) IMPLANT
SUT PDS AB 1 TP1 96 (SUTURE) IMPLANT
SUT SILK 2 0 (SUTURE)
SUT SILK 2 0 SH CR/8 (SUTURE) ×3 IMPLANT
SUT SILK 2 0 TIES 10X30 (SUTURE) ×3 IMPLANT
SUT SILK 2-0 18XBRD TIE 12 (SUTURE) IMPLANT
SUT SILK 3 0 SH CR/8 (SUTURE) ×3 IMPLANT
SUT SILK 3 0 TIES 10X30 (SUTURE) ×3 IMPLANT
SUT VIC AB 2-0 SH 27 (SUTURE)
SUT VIC AB 2-0 SH 27X BRD (SUTURE) IMPLANT
SUT VIC AB 3-0 SH 18 (SUTURE) ×3 IMPLANT
SUT VIC AB 3-0 SH 27 (SUTURE)
SUT VIC AB 3-0 SH 27XBRD (SUTURE) IMPLANT
TAPE CLOTH SURG 4X10 WHT LF (GAUZE/BANDAGES/DRESSINGS) IMPLANT
TOWEL OR 17X24 6PK STRL BLUE (TOWEL DISPOSABLE) ×3 IMPLANT
TOWEL OR 17X26 10 PK STRL BLUE (TOWEL DISPOSABLE) ×3 IMPLANT
TRAY FOLEY CATH 14FRSI W/METER (CATHETERS) IMPLANT
TUBE ANAEROBIC SPECIMEN COL (MISCELLANEOUS) IMPLANT
TUBE CONNECTING 12X1/4 (SUCTIONS) ×3 IMPLANT
UNDERPAD 30X30 INCONTINENT (UNDERPADS AND DIAPERS) IMPLANT
WATER STERILE IRR 1000ML POUR (IV SOLUTION) ×3 IMPLANT
YANKAUER SUCT BULB TIP NO VENT (SUCTIONS) ×3 IMPLANT
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
## 2012-03-24 NOTE — Transfer of Care (Signed)
Immediate Anesthesia Transfer of Care Note  Patient: [name]  Procedure(s) Performed: Procedure(s) (LRB) with comments: EXPLORATORY LAPAROTOMY (N/A) - small bowel anastomosis with ileostomy IRRIGATION AND DEBRIDEMENT EXTREMITY (Left)  Patient Location: SICU  Anesthesia Type: General  Level of Consciousness: Patient remains intubated per anesthesia plan  Airway & Oxygen Therapy: Patient remains intubated per anesthesia plan  Post-op Assessment: Report given to PACU RN  Post vital signs: Reviewed and stable  Complications: No apparent anesthesia complications

## 2012-03-24 NOTE — Preoperative (Signed)
Beta Blockers   Reason not to administer Beta Blockers:Not Applicable 

## 2012-03-24 NOTE — Progress Notes (Signed)
Agree with above. To OR today for reexploration. Transfuse FFP Platelets and PRBC as needed.  Critically ill.  Wilmon Arms. Corliss Skains, MD, [facility]  03/24/2012 9:19 AM
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
## 2012-03-24 NOTE — Anesthesia Procedure Notes (Addendum)
Date/Time: 03/24/2012 10:00 AM Performed by: De Nurse Pre-anesthesia Checklist: Patient being monitored, Suction available, Emergency Drugs available, Patient identified and Timeout performed Patient Re-evaluated:Patient Re-evaluated prior to inductionOxygen Delivery Method: Circle system utilized Intubation Type: Inhalational induction with existing ETT Placement Confirmation: positive ETCO2 and breath sounds checked- equal and bilateral   03/24/2012 10:28 AM  CVP: Timeout, sterile prep, drape, FBP R neck.  Trendelenburg position.  trocar RIJ 1st pass with US guidance.  2 lumen placed over J wire. Biopatch and sterile dressing on.  Patient tolerated well.  VSS.  Sandford Craze, MD
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
## 2012-03-25 ENCOUNTER — Inpatient Hospital Stay (HOSPITAL_COMMUNITY): Payer: BC Managed Care – PPO

## 2012-03-25 ENCOUNTER — Encounter (HOSPITAL_COMMUNITY): Payer: Self-pay | Admitting: General Surgery

## 2012-03-25 DIAGNOSIS — M7989 Other specified soft tissue disorders: Secondary | ICD-10-CM

## 2012-03-25 LAB — POCT I-STAT 3, VENOUS BLOOD GAS (G3P V)
Acid-Base Excess: 3 mmol/L — ABNORMAL HIGH (ref 0.0–2.0)
Bicarbonate: 27.2 mEq/L — ABNORMAL HIGH (ref 20.0–24.0)
O2 Saturation: 97 %
Patient temperature: 38.2
TCO2: 28 mmol/L (ref 0–100)

## 2012-03-25 LAB — CBC
MCH: 30 pg (ref 26.0–34.0)
MCHC: 34 g/dL (ref 30.0–36.0)
MCV: 88.2 fL (ref 78.0–100.0)
Platelets: 117 10*3/uL — ABNORMAL LOW (ref 150–400)
RDW: 13.9 % (ref 11.5–15.5)

## 2012-03-25 LAB — PREPARE FRESH FROZEN PLASMA: Unit division: 0

## 2012-03-25 LAB — POCT I-STAT 3, ART BLOOD GAS (G3+)
Bicarbonate: 24.1 mEq/L — ABNORMAL HIGH (ref 20.0–24.0)
O2 Saturation: 86 %
Patient temperature: 38.4
TCO2: 25 mmol/L (ref 0–100)
pO2, Arterial: 55 mmHg — ABNORMAL LOW (ref 80.0–100.0)

## 2012-03-25 LAB — PREPARE PLATELET PHERESIS: Unit division: 0

## 2012-03-25 LAB — BASIC METABOLIC PANEL
CO2: 24 mEq/L (ref 19–32)
Calcium: 7.5 mg/dL — ABNORMAL LOW (ref 8.4–10.5)
Creatinine, Ser: 0.75 mg/dL (ref 0.50–1.35)
GFR calc Af Amer: >90 mL/min (ref >90)
GFR calc non Af Amer: >90 mL/min (ref >90)
Sodium: 137 mEq/L (ref 135–145)

## 2012-03-25 LAB — PROTIME-INR: Prothrombin Time: 17 seconds — ABNORMAL HIGH (ref 11.6–15.2)

## 2012-03-25 MED ORDER — LORAZEPAM 2 MG/ML IJ SOLN: 1.0000 mg | INTRAMUSCULAR | Status: DC | PRN

## 2012-03-25 MED ORDER — POTASSIUM CHLORIDE 10 MEQ/50ML IV SOLN
10.0000 meq | INTRAVENOUS | Status: AC
  Administered 2012-03-25: 10 meq via INTRAVENOUS

## 2012-03-25 MED ORDER — IBUPROFEN 100 MG/5ML PO SUSP
600.0000 mg | Freq: Once | ORAL | Status: AC
  Administered 2012-03-25: 600 mg
  Filled 2012-03-25: qty 30

## 2012-03-25 MED ORDER — LORAZEPAM 2 MG/ML IJ SOLN
1.0000 mg | INTRAMUSCULAR | Status: DC | PRN
  Administered 2012-03-25 (×3): 2 mg via INTRAVENOUS
  Administered 2012-03-26: 1 mg via INTRAVENOUS
  Administered 2012-03-26 (×2): 2 mg via INTRAVENOUS
  Administered 2012-03-26: 1 mg via INTRAVENOUS
  Filled 2012-03-25 (×7): qty 1

## 2012-03-25 MED ORDER — HALOPERIDOL LACTATE 5 MG/ML IJ SOLN
5.0000 mg | Freq: Four times a day (QID) | INTRAMUSCULAR | Status: DC | PRN
  Administered 2012-03-26: 5 mg via INTRAVENOUS
  Filled 2012-03-25: qty 1

## 2012-03-25 MED ORDER — POTASSIUM CHLORIDE 10 MEQ/50ML IV SOLN
INTRAVENOUS | Status: AC
  Administered 2012-03-25: 10 meq
  Filled 2012-03-25: qty 50

## 2012-03-25 MED ORDER — FENTANYL CITRATE 0.05 MG/ML IJ SOLN
50.0000 ug | INTRAMUSCULAR | Status: DC | PRN
  Administered 2012-03-25 – 2012-03-26 (×6): 100 ug via INTRAVENOUS
  Administered 2012-03-26 (×2): 50 ug via INTRAVENOUS
  Administered 2012-03-26 (×4): 100 ug via INTRAVENOUS
  Filled 2012-03-25 (×13): qty 2

## 2012-03-25 MED ORDER — ENOXAPARIN SODIUM 40 MG/0.4ML ~~LOC~~ SOLN
40.0000 mg | Freq: Every day | SUBCUTANEOUS | Status: DC
  Administered 2012-03-25 – 2012-03-26 (×2): 40 mg via SUBCUTANEOUS
  Filled 2012-03-25 (×2): qty 0.4

## 2012-03-25 MED ORDER — PIPERACILLIN-TAZOBACTAM 3.375 G IVPB
3.3750 g | Freq: Three times a day (TID) | INTRAVENOUS | Status: DC
  Administered 2012-03-25 – 2012-03-26 (×4): 3.375 g via INTRAVENOUS
  Filled 2012-03-25 (×6): qty 50

## 2012-03-25 MED ORDER — IBUPROFEN 100 MG/5ML PO SUSP
600.0000 mg | Freq: Once | ORAL | Status: DC
  Filled 2012-03-25: qty 30

## 2012-03-25 MED ORDER — HALOPERIDOL LACTATE 5 MG/ML IJ SOLN
5.0000 mg | Freq: Once | INTRAMUSCULAR | Status: AC
  Administered 2012-03-25: 5 mg via INTRAVENOUS
  Filled 2012-03-25: qty 1

## 2012-03-25 MED ORDER — FENTANYL 50 MCG/HR TD PT72
100.0000 ug | MEDICATED_PATCH | TRANSDERMAL | Status: DC
  Administered 2012-03-25: 100 ug via TRANSDERMAL
  Filled 2012-03-25: qty 2

## 2012-03-25 MED ORDER — HALOPERIDOL LACTATE 5 MG/ML IJ SOLN
INTRAMUSCULAR | Status: AC
  Filled 2012-03-25: qty 1
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
## 2012-03-25 NOTE — Progress Notes (Signed)
Chaplain made a follow up visit. Patient was sedated and restrained in bed. Chaplain said a silent prayer and asked nurses how patient was doing. Chaplain thanked nurses for taking care of patient since his arrival to the floor. Chaplain will continue to provide spiritual care to both patient and the family as needed at a later day.

## 2012-03-25 NOTE — Progress Notes (Signed)
UR completed 

## 2012-03-25 NOTE — Progress Notes (Signed)
*  PRELIMINARY RESULTS* Vascular Ultrasound Right upper extremity venous duplex has been completed.  Preliminary findings: no evidence of DVT. Superficial thrombosis involving the right cephalic vein.  Farrel Demark, RDMS, RVT  03/25/2012, 11:06 AM
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
## 2012-03-25 NOTE — Progress Notes (Signed)
09:00 Pt extubated per md order.  Placed on 4L [place].  SPO2 96%   Weak cough.  RT to monitor.

## 2012-03-25 NOTE — Clinical Social Work Note (Signed)
Clinical Social Worker met with patient mother at bedside to offer continued support.  Patient was restless and agitated in the bed with restraints.  Patient mother with a flat affect and slow processing to information on patient behalf.  Patient mother concerned with patient school, work, and bank accounts - CSW contacted [employer] office to assist patient mother in withdrawing patient from the semester worth of classes.  CSW to await return phone call.    Clinical Social Worker spoke with patient mother and father in the waiting area on 2300 who informed CSW that patient may transfer to [facility] for further surgery.  CSW will continue to offer support and be available as needed.  Macario Golds, [place] [phone]

## 2012-03-25 NOTE — Anesthesia Postprocedure Evaluation (Signed)
  Anesthesia Post-op Note  Patient: [name]  Procedure(s) Performed: Procedure(s) (LRB) with comments: EXPLORATORY LAPAROTOMY (N/A) - small bowel anastomosis with ileostomy IRRIGATION AND DEBRIDEMENT EXTREMITY (Left)  Patient Location: SICU  Anesthesia Type: General  Level of Consciousness: awake and alert   Airway and Oxygen Therapy: Patient Spontanous Breathing and Patient connected to nasal cannula oxygen  Post-op Pain: none  Post-op Assessment: Post-op Vital signs reviewed, Patient's Cardiovascular Status Stable and Respiratory Function Stable  Post-op Vital Signs: Reviewed and stable  Complications: No apparent anesthesia complications

## 2012-03-25 NOTE — Progress Notes (Addendum)
Patient ID: [name], male   DOB: 1990-07-28, 21 y.o.   MRN: [id number]
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
## 2012-03-25 NOTE — Progress Notes (Signed)
Patient ID: [name], male   DOB: [birth year], 21 y.o.   MRN: [id number] I spoke with his mother at the bedside and answered her questions. Violeta Gelinas, MD, MPH, FACS Pager: [phone]

## 2012-03-26 ENCOUNTER — Encounter (HOSPITAL_COMMUNITY): Payer: Self-pay | Admitting: Certified Registered Nurse Anesthetist

## 2012-03-26 LAB — TYPE AND SCREEN
ABO/RH(D): O POS
Antibody Screen: NEGATIVE
Unit division: 0
Unit division: 0
Unit division: 0
Unit division: 0
Unit division: 0

## 2012-03-26 LAB — CBC
HCT: 29.9 % — ABNORMAL LOW (ref 39.0–52.0)
MCH: 30.5 pg (ref 26.0–34.0)
MCV: 87.7 fL (ref 78.0–100.0)
Platelets: 130 10*3/uL — ABNORMAL LOW (ref 150–400)
RDW: 13.7 % (ref 11.5–15.5)

## 2012-03-26 LAB — BASIC METABOLIC PANEL
BUN: 6 mg/dL (ref 6–23)
Calcium: 8 mg/dL — ABNORMAL LOW (ref 8.4–10.5)
Creatinine, Ser: 0.7 mg/dL (ref 0.50–1.35)
GFR calc Af Amer: >90 mL/min (ref >90)

## 2012-03-26 LAB — URINE CULTURE

## 2012-03-26 MED ORDER — IBUPROFEN 100 MG/5ML PO SUSP
600.0000 mg | Freq: Three times a day (TID) | ORAL | Status: DC | PRN
  Administered 2012-03-26: 600 mg
  Filled 2012-03-26: qty 30

## 2012-03-26 MED ORDER — POTASSIUM CHLORIDE 10 MEQ/50ML IV SOLN
INTRAVENOUS | Status: AC
  Filled 2012-03-26: qty 50

## 2012-03-26 MED ORDER — POTASSIUM CHLORIDE 10 MEQ/50ML IV SOLN
10.0000 meq | INTRAVENOUS | Status: AC
  Administered 2012-03-26 (×3): 10 meq via INTRAVENOUS
  Filled 2012-03-26: qty 100
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
## 2012-03-26 NOTE — Clinical Social Work Note (Signed)
Clinical Social Worker following patient for emotional support.  Patient mother had requested that patient be withdrawn from [employer] for the semester.  CSW was able to contact [employer] Lions in the registrars office, who provided an email address for written documentation.  CSW spoke with patient mother over the phone to confirm her comfort in CSW sharing information with this school to assist patient with withdraw from school for the semester.  Patient mother did provide permission and requested that the financial office also be called to question possible tuition reimbursement.  Clinical Social Worker provided the following email to [facility] on patient behalf:  Verden Lions,  I am sorry to tell you that Mr. Eyad Rochford (DOB 07-11-1991) is currently at [facility] with possibility of transfer to another local hospital to receive further medical treatment.  Mr. Janney is unable to attend classes for this term due to medical reasons.  David's mother is able to complete any needed documentation for him to withdraw if needed.  If you have further questions please feel free to contact.  Thank you for your understanding.    Macario Golds, LCSW Clinical Social Work [facility] [phone] [email or web address]   Clinical Social Worker will follow up with financial office to inquire about possible tuition reimbursement.  Patient has been transferred to [facility] via [facility].  CSW to follow up with patient mother over the phone with follow up information.  Macario Golds, [place] [phone]
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
## 2012-03-26 NOTE — Progress Notes (Signed)
Pt being transferred to [facility] via [facility].  Report called to Marisue Ivan, RN on 4A Bed 9.  Report given to [facility] RN's before transfer.

## 2012-03-30 LAB — CULTURE, BLOOD (ROUTINE X 2): Culture: NO GROWTH

## 2012-03-31 LAB — CULTURE, BLOOD (ROUTINE X 2)

## 2012-04-17 ENCOUNTER — Telehealth (HOSPITAL_COMMUNITY): Payer: Self-pay | Admitting: Orthopedic Surgery

## 2012-04-17 NOTE — Telephone Encounter (Signed)
Left message

## 2012-04-18 ENCOUNTER — Telehealth (HOSPITAL_COMMUNITY): Payer: Self-pay | Admitting: Orthopedic Surgery

## 2012-04-18 NOTE — Telephone Encounter (Signed)
Mom wanted to have follow-up here instead of in [place]. Will get records from [facility] but based on what she told me her follow-ups were I think we can accomodate his ongoing care.

## 2012-04-19 ENCOUNTER — Telehealth: Payer: Self-pay | Admitting: Orthopedic Surgery

## 2012-04-19 NOTE — Telephone Encounter (Signed)
Left message

## 2012-04-22 ENCOUNTER — Telehealth (HOSPITAL_COMMUNITY): Payer: Self-pay | Admitting: Orthopedic Surgery

## 2012-04-22 NOTE — Telephone Encounter (Signed)
Gave appt for 10/10 with [place] and told her that Allie Bossier agreed to see him from ortho/hand standpoint.

## 2012-04-25 ENCOUNTER — Encounter (INDEPENDENT_AMBULATORY_CARE_PROVIDER_SITE_OTHER): Payer: Self-pay

## 2012-04-25 ENCOUNTER — Ambulatory Visit (INDEPENDENT_AMBULATORY_CARE_PROVIDER_SITE_OTHER): Payer: BC Managed Care – PPO | Admitting: Orthopedic Surgery

## 2012-04-25 VITALS — BP 120/74 | HR 72 | Temp 98.6°F | Resp 16 | Ht 69.0 in | Wt 138.2 lb

## 2012-04-25 DIAGNOSIS — G629 Polyneuropathy, unspecified: Secondary | ICD-10-CM

## 2012-04-25 DIAGNOSIS — G589 Mononeuropathy, unspecified: Secondary | ICD-10-CM
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
## 2012-04-26 ENCOUNTER — Telehealth (HOSPITAL_COMMUNITY): Payer: Self-pay | Admitting: Orthopedic Surgery

## 2012-04-26 NOTE — Telephone Encounter (Signed)
Spoke with mom who asked that I write scripts for the meds I recommended yesterday. After speaking with [name]'s nurse I called the pharmacy and called in the tramadol and Lyrica. To simplify things, I switched his oxy IR 15mg  q6h to Norco 10/325; 1-2 po q4h.

## 2012-05-04 ENCOUNTER — Telehealth (HOSPITAL_COMMUNITY): Payer: Self-pay | Admitting: *Deleted

## 2012-05-04 NOTE — ED Notes (Signed)
Trauma Surgeon returned call and talked to [facility] RN,PFM. MD stated that it was ok to wait to Monday and call office for appointment.

## 2012-05-04 NOTE — ED Notes (Signed)
Mother called because she was concerned that patient had a bowel movement.She was trying to get in touch with Trauma MD. We are currently trying to help with the process.

## 2012-05-07 ENCOUNTER — Telehealth (HOSPITAL_COMMUNITY): Payer: Self-pay | Admitting: Orthopedic Surgery

## 2012-05-07 NOTE — Telephone Encounter (Signed)
Left message about referral to pain clinic

## 2012-05-08 NOTE — Telephone Encounter (Signed)
Tharun's mom needed referral to pain clinic on plastic surgery's recommendation. Will fax her a list of clinics in the area for her to contact and then she'll get back to me where she wants the referral.

## 2012-05-13 ENCOUNTER — Encounter (HOSPITAL_BASED_OUTPATIENT_CLINIC_OR_DEPARTMENT_OTHER): Payer: BC Managed Care – PPO | Attending: General Surgery

## 2012-05-13 DIAGNOSIS — S31109A Unspecified open wound of abdominal wall, unspecified quadrant without penetration into peritoneal cavity, initial encounter: Secondary | ICD-10-CM | POA: Insufficient documentation
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
## 2012-05-15 ENCOUNTER — Telehealth (INDEPENDENT_AMBULATORY_CARE_PROVIDER_SITE_OTHER): Payer: Self-pay | Admitting: Orthopedic Surgery

## 2012-05-15 MED ORDER — PREGABALIN 150 MG PO CAPS: 150.0000 mg | ORAL_CAPSULE | Freq: Two times a day (BID) | ORAL | Status: DC

## 2012-05-15 NOTE — Telephone Encounter (Signed)
Got faxed request for refill on Lyrica. Called in increased dose to 150mg  bid.

## 2012-05-20 ENCOUNTER — Encounter (HOSPITAL_BASED_OUTPATIENT_CLINIC_OR_DEPARTMENT_OTHER): Payer: BC Managed Care – PPO | Attending: General Surgery

## 2012-05-20 DIAGNOSIS — S31109A Unspecified open wound of abdominal wall, unspecified quadrant without penetration into peritoneal cavity, initial encounter: Secondary | ICD-10-CM | POA: Insufficient documentation

## 2012-05-20 DIAGNOSIS — W3400XA Accidental discharge from unspecified firearms or gun, initial encounter: Secondary | ICD-10-CM | POA: Insufficient documentation

## 2012-05-24 ENCOUNTER — Emergency Department (HOSPITAL_COMMUNITY): Payer: BC Managed Care – PPO

## 2012-05-24 ENCOUNTER — Encounter (HOSPITAL_COMMUNITY): Payer: Self-pay | Admitting: Emergency Medicine

## 2012-05-24 ENCOUNTER — Emergency Department (HOSPITAL_COMMUNITY)
Admission: EM | Admit: 2012-05-24 | Discharge: 2012-05-24 | Disposition: A | Discharge status: Home / Self Care | Payer: BC Managed Care – PPO | Attending: Emergency Medicine | Admitting: Emergency Medicine

## 2012-05-24 DIAGNOSIS — Z7901 Long term (current) use of anticoagulants: Secondary | ICD-10-CM | POA: Insufficient documentation

## 2012-05-24 DIAGNOSIS — M25559 Pain in unspecified hip: Secondary | ICD-10-CM | POA: Insufficient documentation

## 2012-05-24 DIAGNOSIS — R112 Nausea with vomiting, unspecified: Secondary | ICD-10-CM | POA: Insufficient documentation

## 2012-05-24 DIAGNOSIS — Z862 Personal history of diseases of the blood and blood-forming organs and certain disorders involving the immune mechanism: Secondary | ICD-10-CM | POA: Insufficient documentation

## 2012-05-24 DIAGNOSIS — R109 Unspecified abdominal pain: Secondary | ICD-10-CM | POA: Insufficient documentation

## 2012-05-24 DIAGNOSIS — M79651 Pain in right thigh: Secondary | ICD-10-CM

## 2012-05-24 DIAGNOSIS — Z87828 Personal history of other (healed) physical injury and trauma: Secondary | ICD-10-CM | POA: Insufficient documentation

## 2012-05-24 DIAGNOSIS — F17200 Nicotine dependence, unspecified, uncomplicated: Secondary | ICD-10-CM | POA: Insufficient documentation

## 2012-05-24 MED ORDER — ONDANSETRON HCL 4 MG/2ML IJ SOLN
4.0000 mg | Freq: Once | INTRAMUSCULAR | Status: AC
  Administered 2012-05-24: 4 mg via INTRAVENOUS
  Filled 2012-05-24: qty 2

## 2012-05-24 MED ORDER — SODIUM CHLORIDE 0.9 % IV BOLUS (SEPSIS)
500.0000 mL | Freq: Once | INTRAVENOUS | Status: AC
  Administered 2012-05-24: 500 mL via INTRAVENOUS

## 2012-05-24 MED ORDER — MORPHINE SULFATE 4 MG/ML IJ SOLN
4.0000 mg | Freq: Once | INTRAMUSCULAR | Status: AC
Start: 2012-05-24 — End: 2012-05-24
  Administered 2012-05-24: 4 mg via INTRAVENOUS
  Filled 2012-05-24: qty 1

## 2012-05-24 MED ORDER — MORPHINE SULFATE 4 MG/ML IJ SOLN
6.0000 mg | Freq: Once | INTRAMUSCULAR | Status: AC
  Administered 2012-05-24: 6 mg via INTRAVENOUS
  Filled 2012-05-24: qty 2
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
## 2012-05-24 NOTE — ED Provider Notes (Signed)
Medical screening examination/treatment/procedure(s) were performed by non-physician practitioner and as supervising physician I was immediately available for consultation/collaboration.  Ernest Popowski R. Yarimar Lavis, MD 05/24/12 1614 
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
## 2012-06-03 ENCOUNTER — Telehealth (HOSPITAL_COMMUNITY): Payer: Self-pay | Admitting: Orthopedic Surgery
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
## 2012-06-04 NOTE — Telephone Encounter (Signed)
[name] called for referral to pain clinic and said he was out of meds. He is no longer living in the facility. I told him to ask his mom if she got the fax I sent with the clinics. If so, he needs to call them, figure out which one he wants to go to, then call me back if they need a referral. If not, he'll call me back so I can send him another list. We discussed again the colostomy reversal and possible scar revision at that time.

## 2012-06-05 ENCOUNTER — Telehealth (INDEPENDENT_AMBULATORY_CARE_PROVIDER_SITE_OTHER): Payer: Self-pay | Admitting: General Surgery

## 2012-06-05 NOTE — Telephone Encounter (Signed)
Mother called to let Earney Hamburg, PA-C , know she has "selected" [facility] from the list he provided to get treatment for her son.  They told her they do require a referral and medical records be sent to set up appts for the pt.

## 2012-06-10 ENCOUNTER — Telehealth (HOSPITAL_COMMUNITY): Payer: Self-pay | Admitting: Orthopedic Surgery

## 2012-06-10 ENCOUNTER — Encounter (HOSPITAL_BASED_OUTPATIENT_CLINIC_OR_DEPARTMENT_OTHER): Payer: BC Managed Care – PPO

## 2012-06-11 ENCOUNTER — Encounter (INDEPENDENT_AMBULATORY_CARE_PROVIDER_SITE_OTHER): Payer: Self-pay | Admitting: General Surgery

## 2012-06-11 ENCOUNTER — Telehealth (INDEPENDENT_AMBULATORY_CARE_PROVIDER_SITE_OTHER): Payer: Self-pay | Admitting: General Surgery

## 2012-06-11 NOTE — Telephone Encounter (Signed)
Talked with the patients mother on the phone [place] and does not have any medications left and needs a refill.  She then notes that he hasn't taken his coumadin in over 2 weeks because he doesn't think he needs it.  I then asked the mother to give me the phone number of the father and patient to clarify the issue.  The patient states he is in fact taking his coumadin 10mg  M/W/F and 12.5mg  T/Th/Sa/Su as prescribed.  He is also taking:  Cymbalta 30mb BID Norco prn Clonazepam 0.5mg  BID prn Lyrica 75mg  BID Vit C 500mg  BID Zinc sulfate 220mg  daily  I [facility]illed each of these through the [facility].  The [facility] the closest [facility] and have them transferred.  I have him a 15 day supply on each except for the Norco which I gave him #40.  None have refills because the patient is to make an appt with a primary care provider.  He has an appt on December 6th as per mom.  His new pcp is Dr. Barton Fanny, [facility] [address].

## 2012-06-17 ENCOUNTER — Ambulatory Visit (HOSPITAL_BASED_OUTPATIENT_CLINIC_OR_DEPARTMENT_OTHER): Payer: BC Managed Care – PPO

## 2012-06-17 NOTE — Telephone Encounter (Signed)
Addressed by Aris Georgia, PA-C.

## 2012-07-22 ENCOUNTER — Ambulatory Visit (HOSPITAL_BASED_OUTPATIENT_CLINIC_OR_DEPARTMENT_OTHER): Payer: BC Managed Care – PPO

## 2012-07-23 ENCOUNTER — Other Ambulatory Visit: Payer: Self-pay | Admitting: Family Medicine

## 2012-07-23 DIAGNOSIS — I82629 Acute embolism and thrombosis of deep veins of unspecified upper extremity: Secondary | ICD-10-CM

## 2012-07-26 ENCOUNTER — Ambulatory Visit
Admission: RE | Admit: 2012-07-26 | Discharge: 2012-07-26 | Disposition: A | Discharge status: Home / Self Care | Payer: BC Managed Care – PPO | Source: Ambulatory Visit | Attending: Family Medicine | Admitting: Family Medicine

## 2012-07-26 DIAGNOSIS — I82629 Acute embolism and thrombosis of deep veins of unspecified upper extremity: Secondary | ICD-10-CM

## 2012-07-31 ENCOUNTER — Emergency Department (HOSPITAL_COMMUNITY): Payer: BC Managed Care – PPO

## 2012-07-31 ENCOUNTER — Encounter (HOSPITAL_COMMUNITY): Payer: Self-pay | Admitting: Adult Health

## 2012-07-31 ENCOUNTER — Emergency Department (HOSPITAL_COMMUNITY)
Admission: EM | Admit: 2012-07-31 | Discharge: 2012-07-31 | Disposition: A | Discharge status: Home / Self Care | Payer: BC Managed Care – PPO | Attending: Emergency Medicine | Admitting: Emergency Medicine

## 2012-07-31 DIAGNOSIS — F17200 Nicotine dependence, unspecified, uncomplicated: Secondary | ICD-10-CM | POA: Insufficient documentation

## 2012-07-31 DIAGNOSIS — D689 Coagulation defect, unspecified: Secondary | ICD-10-CM | POA: Insufficient documentation

## 2012-07-31 DIAGNOSIS — R109 Unspecified abdominal pain: Secondary | ICD-10-CM | POA: Insufficient documentation

## 2012-07-31 DIAGNOSIS — Z79899 Other long term (current) drug therapy: Secondary | ICD-10-CM | POA: Insufficient documentation

## 2012-07-31 DIAGNOSIS — R1110 Vomiting, unspecified: Secondary | ICD-10-CM | POA: Insufficient documentation

## 2012-07-31 LAB — COMPREHENSIVE METABOLIC PANEL
CO2: 22 mEq/L (ref 19–32)
Calcium: 9.6 mg/dL (ref 8.4–10.5)
Creatinine, Ser: 0.76 mg/dL (ref 0.50–1.35)
GFR calc Af Amer: >90 mL/min (ref >90)
GFR calc non Af Amer: >90 mL/min (ref >90)
Glucose, Bld: 117 mg/dL — ABNORMAL HIGH (ref 70–99)
Total Protein: 8.1 g/dL (ref 6.0–8.3)

## 2012-07-31 LAB — CBC WITH DIFFERENTIAL/PLATELET
Lymphocytes Relative: 8 % — ABNORMAL LOW (ref 12–46)
Lymphs Abs: 1.3 10*3/uL (ref 0.7–4.0)
MCV: 85.7 fL (ref 78.0–100.0)
Neutrophils Relative %: 84 % — ABNORMAL HIGH (ref 43–77)
Platelets: 213 10*3/uL (ref 150–400)
RBC: 5.61 MIL/uL (ref 4.22–5.81)
WBC: 15.7 10*3/uL — ABNORMAL HIGH (ref 4.0–10.5)

## 2012-07-31 LAB — PROTIME-INR
INR: 1.08 (ref 0.00–1.49)
Prothrombin Time: 13.9 seconds (ref 11.6–15.2)

## 2012-07-31 MED ORDER — SODIUM CHLORIDE 0.9 % IV BOLUS (SEPSIS)
1000.0000 mL | Freq: Once | INTRAVENOUS | Status: AC
  Administered 2012-07-31: 1000 mL via INTRAVENOUS

## 2012-07-31 MED ORDER — MORPHINE SULFATE 4 MG/ML IJ SOLN
4.0000 mg | Freq: Once | INTRAMUSCULAR | Status: AC
  Administered 2012-07-31: 4 mg via INTRAVENOUS
  Filled 2012-07-31: qty 1

## 2012-07-31 MED ORDER — ONDANSETRON 4 MG PO TBDP
8.0000 mg | ORAL_TABLET | Freq: Once | ORAL | Status: AC
  Administered 2012-07-31: 8 mg via ORAL
  Filled 2012-07-31 (×2): qty 1

## 2012-07-31 MED ORDER — ONDANSETRON HCL 4 MG PO TABS: 4.0000 mg | ORAL_TABLET | Freq: Four times a day (QID) | ORAL | Status: DC

## 2012-07-31 MED ORDER — ONDANSETRON HCL 4 MG/2ML IJ SOLN
4.0000 mg | Freq: Once | INTRAMUSCULAR | Status: AC
  Administered 2012-07-31: 4 mg via INTRAVENOUS
  Filled 2012-07-31: qty 2

## 2012-07-31 MED ORDER — SODIUM CHLORIDE 0.9 % IV BOLUS (SEPSIS)
500.0000 mL | Freq: Once | INTRAVENOUS | Status: AC
  Administered 2012-07-31: 500 mL via INTRAVENOUS
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
## 2012-08-07 ENCOUNTER — Other Ambulatory Visit: Payer: BC Managed Care – PPO

## 2012-09-04 ENCOUNTER — Telehealth (HOSPITAL_COMMUNITY): Payer: Self-pay | Admitting: Emergency Medicine

## 2012-09-04 NOTE — Telephone Encounter (Signed)
I called the mom to set up this appointment.  She is requesting Dr Corliss Skains because she said he did the original surgery.  Is this ok for the pt to see Dr Corliss Skains?

## 2012-09-05 ENCOUNTER — Telehealth (HOSPITAL_COMMUNITY): Payer: Self-pay | Admitting: Emergency Medicine

## 2012-09-05 NOTE — Telephone Encounter (Signed)
I got the approval to have the pt see Dr Corliss Skains. I called him and gave him an appointment to see him on March 4th.

## 2012-09-05 NOTE — Telephone Encounter (Signed)
I called the pt and gave an appointment for March 4th to see Dr Corliss Skains for discussion of ostomy reversal.

## 2012-09-17 ENCOUNTER — Encounter (INDEPENDENT_AMBULATORY_CARE_PROVIDER_SITE_OTHER): Payer: Self-pay | Admitting: Surgery

## 2012-09-17 ENCOUNTER — Ambulatory Visit (INDEPENDENT_AMBULATORY_CARE_PROVIDER_SITE_OTHER): Payer: BC Managed Care – PPO | Admitting: Surgery

## 2012-09-17 VITALS — BP 132/64 | HR 84 | Temp 97.2°F | Resp 16 | Ht 69.0 in | Wt 149.2 lb

## 2012-09-17 DIAGNOSIS — S36408D Unspecified injury of other part of small intestine, subsequent encounter: Secondary | ICD-10-CM

## 2012-09-17 DIAGNOSIS — Z5189 Encounter for other specified aftercare: Secondary | ICD-10-CM

## 2012-09-17 MED ORDER — OXYCODONE-ACETAMINOPHEN 5-325 MG PO TABS: 1.0000 | ORAL_TABLET | ORAL | Status: DC | PRN
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
## 2012-09-23 ENCOUNTER — Ambulatory Visit
Admission: RE | Admit: 2012-09-23 | Discharge: 2012-09-23 | Disposition: A | Discharge status: Home / Self Care | Payer: BC Managed Care – PPO | Source: Ambulatory Visit | Attending: Surgery | Admitting: Surgery

## 2012-09-23 MED ORDER — IOHEXOL 300 MG/ML  SOLN
100.0000 mL | Freq: Once | INTRAMUSCULAR | Status: AC | PRN
  Administered 2012-09-23: 100 mL via INTRAVENOUS

## 2012-09-27 ENCOUNTER — Telehealth (INDEPENDENT_AMBULATORY_CARE_PROVIDER_SITE_OTHER): Payer: Self-pay

## 2012-09-27 NOTE — Telephone Encounter (Signed)
Unable to reach patient.  He has post op appt on 09/30/12 so I will give the Rx to Dr. Fatima Sanger nurse.

## 2012-09-30 ENCOUNTER — Encounter (INDEPENDENT_AMBULATORY_CARE_PROVIDER_SITE_OTHER): Payer: Self-pay | Admitting: Surgery

## 2012-09-30 ENCOUNTER — Ambulatory Visit (INDEPENDENT_AMBULATORY_CARE_PROVIDER_SITE_OTHER): Payer: BC Managed Care – PPO | Admitting: Surgery

## 2012-09-30 VITALS — BP 108/84 | HR 88 | Temp 98.5°F | Resp 16 | Ht 69.0 in | Wt 156.8 lb

## 2012-09-30 DIAGNOSIS — K439 Ventral hernia without obstruction or gangrene: Secondary | ICD-10-CM

## 2012-09-30 DIAGNOSIS — Z932 Ileostomy status: Secondary | ICD-10-CM
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
## 2012-10-08 ENCOUNTER — Encounter (HOSPITAL_COMMUNITY): Payer: Self-pay | Admitting: Respiratory Therapy

## 2012-10-16 ENCOUNTER — Encounter (HOSPITAL_COMMUNITY): Payer: Self-pay

## 2012-10-16 ENCOUNTER — Encounter (HOSPITAL_COMMUNITY)
Admission: RE | Admit: 2012-10-16 | Discharge: 2012-10-16 | Disposition: A | Discharge status: Home / Self Care | Payer: BC Managed Care – PPO | Source: Ambulatory Visit | Attending: Surgery | Admitting: Surgery

## 2012-10-16 DIAGNOSIS — I519 Heart disease, unspecified: Secondary | ICD-10-CM | POA: Insufficient documentation

## 2012-10-16 DIAGNOSIS — I10 Essential (primary) hypertension: Secondary | ICD-10-CM | POA: Insufficient documentation

## 2012-10-16 DIAGNOSIS — Z01818 Encounter for other preprocedural examination: Secondary | ICD-10-CM | POA: Insufficient documentation

## 2012-10-16 DIAGNOSIS — E119 Type 2 diabetes mellitus without complications: Secondary | ICD-10-CM | POA: Insufficient documentation

## 2012-10-16 DIAGNOSIS — Z01812 Encounter for preprocedural laboratory examination: Secondary | ICD-10-CM | POA: Insufficient documentation

## 2012-10-16 HISTORY — DX: Anxiety disorder, unspecified: F41.9

## 2012-10-16 HISTORY — DX: Accidental discharge from unspecified firearms or gun, initial encounter: W34.00XA

## 2012-10-16 HISTORY — DX: Major depressive disorder, single episode, unspecified: F32.9

## 2012-10-16 HISTORY — DX: Depression, unspecified: F32.A

## 2012-10-16 HISTORY — DX: Oppositional defiant disorder: F91.3

## 2012-10-16 LAB — BASIC METABOLIC PANEL
GFR calc Af Amer: >90 mL/min (ref >90)
GFR calc non Af Amer: >90 mL/min (ref >90)
Potassium: 3.9 mEq/L (ref 3.5–5.1)
Sodium: 140 mEq/L (ref 135–145)

## 2012-10-16 LAB — SURGICAL PCR SCREEN
MRSA, PCR: NEGATIVE
Staphylococcus aureus: POSITIVE — AB

## 2012-10-16 LAB — CBC
MCHC: 37 g/dL — ABNORMAL HIGH (ref 30.0–36.0)
Platelets: 251 10*3/uL (ref 150–400)
RDW: 12.6 % (ref 11.5–15.5)
WBC: 8.8 10*3/uL (ref 4.0–10.5)

## 2012-10-16 NOTE — Pre-Procedure Instructions (Signed)
[name]  10/16/2012   Your procedure is scheduled on:  Tuesday, April 8th  Report to [facility] at 0530 AM.  Call this number if you have problems the morning of surgery: [phone]   Remember:   Do not eat food or drink liquids after midnight.    Take these medicines the morning of surgery with A SIP OF WATER: percocet if needed   Do not wear jewelry, make-up or nail polish.  Do not wear lotions, powders, or perfumes, deodorant.  Do not shave 48 hours prior to surgery. Men may shave face and neck.  Do not bring valuables to the hospital.  Contacts, dentures or bridgework may not be worn into surgery.  Leave suitcase in the car. After surgery it may be brought to your room.  For patients admitted to the hospital, checkout time is 11:00 AM the day of discharge.   Patients discharged the day of surgery will not be allowed to drive home.    Special Instructions: Shower using CHG 2 nights before surgery and the night before surgery.  If you shower the day of surgery use CHG.  Use special wash - you have one bottle of CHG for all showers.  You should use approximately 1/3 of the bottle for each shower.   Please read over the following fact sheets that you were given: Pain Booklet, Coughing and Deep Breathing, MRSA Information and Surgical Site Infection Prevention

## 2012-10-21 MED ORDER — SODIUM CHLORIDE 0.9 % IV SOLN
1.0000 g | INTRAVENOUS | Status: AC
  Administered 2012-10-22: 1 g via INTRAVENOUS
  Filled 2012-10-21: qty 1

## 2012-10-22 ENCOUNTER — Encounter (HOSPITAL_COMMUNITY): Admission: RE | Disposition: A | Discharge status: Home / Self Care | Payer: Self-pay | Source: Ambulatory Visit

## 2012-10-22 ENCOUNTER — Ambulatory Visit (HOSPITAL_COMMUNITY): Payer: BC Managed Care – PPO | Admitting: Anesthesiology

## 2012-10-22 ENCOUNTER — Encounter (HOSPITAL_COMMUNITY): Payer: Self-pay | Admitting: Anesthesiology

## 2012-10-22 ENCOUNTER — Encounter (HOSPITAL_COMMUNITY): Payer: Self-pay | Admitting: Surgery

## 2012-10-22 ENCOUNTER — Inpatient Hospital Stay (HOSPITAL_COMMUNITY)
Admission: RE | Admit: 2012-10-22 | Discharge: 2012-10-29 | DRG: 148 | Disposition: A | Discharge status: Home / Self Care | Payer: BC Managed Care – PPO | Source: Ambulatory Visit | Attending: General Surgery | Admitting: General Surgery

## 2012-10-22 DIAGNOSIS — G8929 Other chronic pain: Secondary | ICD-10-CM | POA: Diagnosis present

## 2012-10-22 DIAGNOSIS — G8921 Chronic pain due to trauma: Secondary | ICD-10-CM | POA: Diagnosis present

## 2012-10-22 DIAGNOSIS — Z932 Ileostomy status: Secondary | ICD-10-CM

## 2012-10-22 DIAGNOSIS — K560 Paralytic ileus: Secondary | ICD-10-CM | POA: Diagnosis not present

## 2012-10-22 DIAGNOSIS — K439 Ventral hernia without obstruction or gangrene: Secondary | ICD-10-CM | POA: Diagnosis present

## 2012-10-22 DIAGNOSIS — F4310 Post-traumatic stress disorder, unspecified: Secondary | ICD-10-CM | POA: Diagnosis present

## 2012-10-22 DIAGNOSIS — F17200 Nicotine dependence, unspecified, uncomplicated: Secondary | ICD-10-CM | POA: Diagnosis present

## 2012-10-22 DIAGNOSIS — Y832 Surgical operation with anastomosis, bypass or graft as the cause of abnormal reaction of the patient, or of later complication, without mention of misadventure at the time of the procedure: Secondary | ICD-10-CM | POA: Diagnosis not present

## 2012-10-22 DIAGNOSIS — Z432 Encounter for attention to ileostomy: Principal | ICD-10-CM

## 2012-10-22 DIAGNOSIS — K929 Disease of digestive system, unspecified: Secondary | ICD-10-CM | POA: Diagnosis not present

## 2012-10-22 DIAGNOSIS — Z01812 Encounter for preprocedural laboratory examination: Secondary | ICD-10-CM

## 2012-10-22 DIAGNOSIS — Y9210 Unspecified residential institution as the place of occurrence of the external cause: Secondary | ICD-10-CM | POA: Diagnosis not present

## 2012-10-22 HISTORY — PX: LAPAROTOMY: SHX154

## 2012-10-22 SURGERY — LAPAROTOMY, EXPLORATORY
Anesthesia: General | Wound class: Clean Contaminated

## 2012-10-22 MED ORDER — ONDANSETRON HCL 4 MG/2ML IJ SOLN
4.0000 mg | Freq: Four times a day (QID) | INTRAMUSCULAR | Status: DC | PRN
  Administered 2012-10-25 – 2012-10-27 (×4): 4 mg via INTRAVENOUS
  Filled 2012-10-22: qty 2

## 2012-10-22 MED ORDER — NEOSTIGMINE METHYLSULFATE 1 MG/ML IJ SOLN
INTRAMUSCULAR | Status: DC | PRN
  Administered 2012-10-22: 4 mg via INTRAVENOUS

## 2012-10-22 MED ORDER — SODIUM CHLORIDE 0.9 % IJ SOLN: 9.0000 mL | INTRAMUSCULAR | Status: DC | PRN

## 2012-10-22 MED ORDER — PROPOFOL 10 MG/ML IV BOLUS
INTRAVENOUS | Status: DC | PRN
  Administered 2012-10-22: 200 mg via INTRAVENOUS

## 2012-10-22 MED ORDER — SODIUM CHLORIDE 0.9 % IV SOLN
1.0000 g | INTRAVENOUS | Status: AC
  Administered 2012-10-23: 1 g via INTRAVENOUS
  Filled 2012-10-22: qty 1

## 2012-10-22 MED ORDER — 0.9 % SODIUM CHLORIDE (POUR BTL) OPTIME
TOPICAL | Status: DC | PRN
  Administered 2012-10-22: 1000 mL

## 2012-10-22 MED ORDER — HYDROMORPHONE HCL PF 1 MG/ML IJ SOLN
1.0000 mg | Freq: Once | INTRAMUSCULAR | Status: AC
  Administered 2012-10-22: 1 mg via INTRAVENOUS

## 2012-10-22 MED ORDER — KCL IN DEXTROSE-NACL 20-5-0.45 MEQ/L-%-% IV SOLN
INTRAVENOUS | Status: DC
  Administered 2012-10-22 – 2012-10-23 (×2): via INTRAVENOUS
  Administered 2012-10-23: 100 mL/h via INTRAVENOUS
  Administered 2012-10-23 – 2012-10-27 (×9): via INTRAVENOUS
  Filled 2012-10-22 (×21): qty 1000

## 2012-10-22 MED ORDER — ONDANSETRON HCL 4 MG/2ML IJ SOLN
4.0000 mg | Freq: Four times a day (QID) | INTRAMUSCULAR | Status: DC | PRN
  Filled 2012-10-22 (×3): qty 2

## 2012-10-22 MED ORDER — FENTANYL CITRATE 0.05 MG/ML IJ SOLN
INTRAMUSCULAR | Status: DC | PRN
  Administered 2012-10-22: 50 ug via INTRAVENOUS
  Administered 2012-10-22: 150 ug via INTRAVENOUS
  Administered 2012-10-22 (×3): 100 ug via INTRAVENOUS

## 2012-10-22 MED ORDER — LACTATED RINGERS IV SOLN
INTRAVENOUS | Status: DC | PRN
  Administered 2012-10-22 (×2): via INTRAVENOUS

## 2012-10-22 MED ORDER — HYDROMORPHONE HCL PF 1 MG/ML IJ SOLN
INTRAMUSCULAR | Status: DC | PRN
  Administered 2012-10-22 (×2): 0.5 mg via INTRAVENOUS

## 2012-10-22 MED ORDER — ENOXAPARIN SODIUM 40 MG/0.4ML ~~LOC~~ SOLN: 40.0000 mg | SUBCUTANEOUS | Status: DC

## 2012-10-22 MED ORDER — LIDOCAINE HCL (CARDIAC) 20 MG/ML IV SOLN
INTRAVENOUS | Status: DC | PRN
  Administered 2012-10-22: 80 mg via INTRAVENOUS

## 2012-10-22 MED ORDER — CHLORHEXIDINE GLUCONATE 4 % EX LIQD: 1.0000 "application " | Freq: Once | CUTANEOUS | Status: DC

## 2012-10-22 MED ORDER — DIPHENHYDRAMINE HCL 12.5 MG/5ML PO ELIX: 12.5000 mg | ORAL_SOLUTION | Freq: Four times a day (QID) | ORAL | Status: DC | PRN

## 2012-10-22 MED ORDER — VECURONIUM BROMIDE 10 MG IV SOLR
INTRAVENOUS | Status: DC | PRN
  Administered 2012-10-22: 1 mg via INTRAVENOUS
  Administered 2012-10-22: 2 mg via INTRAVENOUS
  Administered 2012-10-22 (×2): 3 mg via INTRAVENOUS
  Administered 2012-10-22: 1 mg via INTRAVENOUS

## 2012-10-22 MED ORDER — KETOROLAC TROMETHAMINE 15 MG/ML IJ SOLN
15.0000 mg | Freq: Four times a day (QID) | INTRAMUSCULAR | Status: DC
  Administered 2012-10-22 – 2012-10-23 (×4): 15 mg via INTRAVENOUS
  Filled 2012-10-22 (×8): qty 1

## 2012-10-22 MED ORDER — NALOXONE HCL 0.4 MG/ML IJ SOLN: 0.4000 mg | INTRAMUSCULAR | Status: DC | PRN

## 2012-10-22 MED ORDER — HYDROMORPHONE 0.3 MG/ML IV SOLN
INTRAVENOUS | Status: DC
  Administered 2012-10-22: 23 mL via INTRAVENOUS

## 2012-10-22 MED ORDER — ONDANSETRON HCL 4 MG/2ML IJ SOLN: 4.0000 mg | Freq: Four times a day (QID) | INTRAMUSCULAR | Status: DC | PRN

## 2012-10-22 MED ORDER — ONDANSETRON HCL 4 MG/2ML IJ SOLN
INTRAMUSCULAR | Status: DC | PRN
  Administered 2012-10-22: 4 mg via INTRAVENOUS

## 2012-10-22 MED ORDER — DIPHENHYDRAMINE HCL 50 MG/ML IJ SOLN: 12.5000 mg | Freq: Four times a day (QID) | INTRAMUSCULAR | Status: DC | PRN

## 2012-10-22 MED ORDER — HYDROMORPHONE HCL PF 1 MG/ML IJ SOLN
INTRAMUSCULAR | Status: AC
  Filled 2012-10-22: qty 2

## 2012-10-22 MED ORDER — HYDROMORPHONE HCL PF 1 MG/ML IJ SOLN
0.2500 mg | INTRAMUSCULAR | Status: DC | PRN
  Administered 2012-10-22 (×4): 0.5 mg via INTRAVENOUS

## 2012-10-22 MED ORDER — ACETAMINOPHEN 10 MG/ML IV SOLN
INTRAVENOUS | Status: AC
  Administered 2012-10-22: 1000 mg
  Filled 2012-10-22: qty 100

## 2012-10-22 MED ORDER — ENOXAPARIN SODIUM 40 MG/0.4ML ~~LOC~~ SOLN
40.0000 mg | SUBCUTANEOUS | Status: DC
  Administered 2012-10-23 – 2012-10-29 (×7): 40 mg via SUBCUTANEOUS
  Filled 2012-10-22 (×7): qty 0.4

## 2012-10-22 MED ORDER — ONDANSETRON HCL 4 MG/2ML IJ SOLN: 4.0000 mg | Freq: Once | INTRAMUSCULAR | Status: DC | PRN

## 2012-10-22 MED ORDER — ONDANSETRON HCL 4 MG PO TABS: 4.0000 mg | ORAL_TABLET | Freq: Four times a day (QID) | ORAL | Status: DC | PRN

## 2012-10-22 MED ORDER — DIPHENHYDRAMINE HCL 50 MG/ML IJ SOLN
12.5000 mg | Freq: Four times a day (QID) | INTRAMUSCULAR | Status: DC | PRN
  Administered 2012-10-22 – 2012-10-28 (×12): 12.5 mg via INTRAVENOUS
  Filled 2012-10-22 (×13): qty 1

## 2012-10-22 MED ORDER — HYDROMORPHONE 0.3 MG/ML IV SOLN
INTRAVENOUS | Status: DC
  Administered 2012-10-22: 2.72 mg via INTRAVENOUS
  Administered 2012-10-22: 3.49 mg via INTRAVENOUS
  Administered 2012-10-22: 5.4 mg via INTRAVENOUS
  Administered 2012-10-23: 4.99 mg via INTRAVENOUS
  Administered 2012-10-23: 5.42 mg via INTRAVENOUS
  Administered 2012-10-23: 4.02 mg via INTRAVENOUS
  Administered 2012-10-23: 2.69 mg via INTRAVENOUS
  Administered 2012-10-23: 1.73 mg via INTRAVENOUS
  Administered 2012-10-23: 18:00:00 via INTRAVENOUS
  Administered 2012-10-23: 3.44 mg via INTRAVENOUS
  Administered 2012-10-24: 1.44 mg via INTRAVENOUS
  Administered 2012-10-24: 1.88 mg via INTRAVENOUS
  Filled 2012-10-22 (×6): qty 25

## 2012-10-22 MED ORDER — HYDROMORPHONE HCL PF 1 MG/ML IJ SOLN
INTRAMUSCULAR | Status: AC
  Filled 2012-10-22: qty 1

## 2012-10-22 MED ORDER — KETOROLAC TROMETHAMINE 30 MG/ML IJ SOLN
INTRAMUSCULAR | Status: AC
  Administered 2012-10-22: 15 mg
  Filled 2012-10-22: qty 1

## 2012-10-22 MED ORDER — GLYCOPYRROLATE 0.2 MG/ML IJ SOLN
INTRAMUSCULAR | Status: DC | PRN
  Administered 2012-10-22: 0.6 mg via INTRAVENOUS

## 2012-10-22 MED ORDER — HYDROMORPHONE 0.3 MG/ML IV SOLN
INTRAVENOUS | Status: AC
  Administered 2012-10-22: 19:00:00
  Filled 2012-10-22: qty 25

## 2012-10-22 MED ORDER — ROCURONIUM BROMIDE 100 MG/10ML IV SOLN
INTRAVENOUS | Status: DC | PRN
  Administered 2012-10-22: 50 mg via INTRAVENOUS

## 2012-10-22 MED ORDER — MIDAZOLAM HCL 5 MG/5ML IJ SOLN
INTRAMUSCULAR | Status: DC | PRN
  Administered 2012-10-22: 2 mg via INTRAVENOUS

## 2012-10-22 SURGICAL SUPPLY — 53 items
BLADE SURG ROTATE 9660 (MISCELLANEOUS) ×2 IMPLANT
CANISTER SUCTION 2500CC (MISCELLANEOUS) ×2 IMPLANT
CHLORAPREP W/TINT 26ML (MISCELLANEOUS) ×2 IMPLANT
CLOTH BEACON ORANGE TIMEOUT ST (SAFETY) ×2 IMPLANT
COVER SURGICAL LIGHT HANDLE (MISCELLANEOUS) ×2 IMPLANT
DRAIN CHANNEL 19F RND (DRAIN) ×2 IMPLANT
DRAPE LAPAROSCOPIC ABDOMINAL (DRAPES) ×2 IMPLANT
DRAPE UTILITY 15X26 W/TAPE STR (DRAPE) ×4 IMPLANT
DRAPE WARM FLUID 44X44 (DRAPE) ×2 IMPLANT
DRSG OPSITE POSTOP 3X4 (GAUZE/BANDAGES/DRESSINGS) ×4 IMPLANT
DRSG OPSITE POSTOP 4X10 (GAUZE/BANDAGES/DRESSINGS) ×2 IMPLANT
ELECT BLADE 6.5 EXT (BLADE) IMPLANT
ELECT CAUTERY BLADE 6.4 (BLADE) ×2 IMPLANT
ELECT REM PT RETURN 9FT ADLT (ELECTROSURGICAL) ×2
ELECTRODE REM PT RTRN 9FT ADLT (ELECTROSURGICAL) ×1 IMPLANT
EVACUATOR SILICONE 100CC (DRAIN) ×2 IMPLANT
GLOVE BIO SURGEON STRL SZ7 (GLOVE) ×4 IMPLANT
GLOVE BIO SURGEON STRL SZ7.5 (GLOVE) ×2 IMPLANT
GLOVE BIO SURGEON STRL SZ8 (GLOVE) ×4 IMPLANT
GLOVE BIOGEL PI IND STRL 7.5 (GLOVE) ×2 IMPLANT
GLOVE BIOGEL PI IND STRL 8 (GLOVE) ×1 IMPLANT
GLOVE BIOGEL PI INDICATOR 7.5 (GLOVE) ×2
GLOVE BIOGEL PI INDICATOR 8 (GLOVE) ×1
GOWN STRL NON-REIN LRG LVL3 (GOWN DISPOSABLE) ×6 IMPLANT
GOWN STRL REIN XL XLG (GOWN DISPOSABLE) ×2 IMPLANT
KIT BASIN OR (CUSTOM PROCEDURE TRAY) ×2 IMPLANT
KIT ROOM TURNOVER OR (KITS) ×2 IMPLANT
LIGASURE IMPACT 36 18CM CVD LR (INSTRUMENTS) ×2 IMPLANT
MARKER SKIN DUAL TIP RULER LAB (MISCELLANEOUS) ×2 IMPLANT
NS IRRIG 1000ML POUR BTL (IV SOLUTION) ×4 IMPLANT
PACK GENERAL/GYN (CUSTOM PROCEDURE TRAY) ×2 IMPLANT
PAD ARMBOARD 7.5X6 YLW CONV (MISCELLANEOUS) ×2 IMPLANT
RELOAD PROXIMATE 75MM BLUE (ENDOMECHANICALS) ×8 IMPLANT
RELOAD PROXIMATE TA60MM BLUE (ENDOMECHANICALS) ×2 IMPLANT
SPECIMEN JAR LARGE (MISCELLANEOUS) IMPLANT
SPONGE GAUZE 4X4 12PLY (GAUZE/BANDAGES/DRESSINGS) ×2 IMPLANT
SPONGE LAP 18X18 X RAY DECT (DISPOSABLE) ×4 IMPLANT
STAPLER GUN LINEAR PROX 60 (STAPLE) ×2 IMPLANT
STAPLER PROXIMATE 75MM BLUE (STAPLE) ×2 IMPLANT
STAPLER VISISTAT 35W (STAPLE) ×2 IMPLANT
SUCTION POOLE TIP (SUCTIONS) ×2 IMPLANT
SUT ETHILON 2 0 FS 18 (SUTURE) ×2 IMPLANT
SUT NOVA 1 T20/GS 25DT (SUTURE) ×10 IMPLANT
SUT PDS AB 1 TP1 96 (SUTURE) ×4 IMPLANT
SUT SILK 2 0 SH CR/8 (SUTURE) ×4 IMPLANT
SUT SILK 2 0 TIES 10X30 (SUTURE) ×2 IMPLANT
SUT SILK 3 0 SH CR/8 (SUTURE) ×4 IMPLANT
SUT SILK 3 0 TIES 10X30 (SUTURE) ×2 IMPLANT
SUT VIC AB 3-0 SH 18 (SUTURE) IMPLANT
TOWEL OR 17X26 10 PK STRL BLUE (TOWEL DISPOSABLE) ×2 IMPLANT
TRAY FOLEY CATH 14FRSI W/METER (CATHETERS) ×2 IMPLANT
WATER STERILE IRR 1000ML POUR (IV SOLUTION) IMPLANT
YANKAUER SUCT BULB TIP NO VENT (SUCTIONS) ×2 IMPLANT
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
## 2012-10-22 NOTE — Progress Notes (Signed)
Mr Hartnett initiated a conversaion regarding sex.  Mr. Espana advised that those activities would be OK'd by Dr. Corliss Skains when he was healed.

## 2012-10-22 NOTE — Transfer of Care (Signed)
Immediate Anesthesia Transfer of Care Note  Patient: [name]  Procedure(s) Performed: Procedure(s): EXPLORATORY LAPAROTOMY ileostomy takedown ventral hernia  (N/A)  Patient Location: PACU  Anesthesia Type:General  Level of Consciousness: awake, alert  and oriented  Airway & Oxygen Therapy: Patient Spontanous Breathing and Patient connected to nasal cannula oxygen  Post-op Assessment: Report given to PACU RN and Post -op Vital signs reviewed and stable  Post vital signs: Reviewed and stable  Complications: No apparent anesthesia complications

## 2012-10-22 NOTE — Preoperative (Signed)
Beta Blockers   Reason not to administer Beta Blockers:Not Applicable 

## 2012-10-22 NOTE — Anesthesia Postprocedure Evaluation (Signed)
  Anesthesia Post-op Note  Patient: [name]  Procedure(s) Performed: Procedure(s): EXPLORATORY LAPAROTOMY ileostomy takedown ventral hernia  (N/A)  Patient Location: PACU  Anesthesia Type:General  Level of Consciousness: awake  Airway and Oxygen Therapy: Patient Spontanous Breathing and Patient connected to nasal cannula oxygen  Post-op Pain: moderate  Post-op Assessment: Post-op Vital signs reviewed, Patient's Cardiovascular Status Stable, Respiratory Function Stable, Patent Airway and No signs of Nausea or vomiting  Post-op Vital Signs: Reviewed and stable  Complications: No apparent anesthesia complications

## 2012-10-22 NOTE — Interval H&P Note (Signed)
History and Physical Interval Note:  10/22/2012 6:35 AM  [name]  has presented today for surgery, with the diagnosis of ileostomy ventral hernia   The various methods of treatment have been discussed with the patient and family. After consideration of risks, benefits and other options for treatment, the patient has consented to  Procedure(s): EXPLORATORY LAPAROTOMY ileostomy takedown ventral hernia  (N/A) INSERTION OF MESH (N/A) as a surgical intervention .  The patient's history has been reviewed, patient examined, no change in status, stable for surgery.  I have reviewed the patient's chart and labs.  Questions were answered to the patient's satisfaction.     [name]

## 2012-10-22 NOTE — Anesthesia Preprocedure Evaluation (Addendum)
Anesthesia Evaluation  Patient identified by MRN, date of birth, ID band Patient awake    Reviewed: Allergy & Precautions, H&P , NPO status , Patient's Chart, lab work & pertinent test results  History of Anesthesia Complications Negative for: history of anesthetic complications  Airway Mallampati: I TM Distance: >3 FB Neck ROM: full    Dental  (+) Dental Advisory Given, Teeth Intact and Chipped,    Pulmonary Current Smoker,          Cardiovascular Rhythm:regular Rate:Normal     Neuro/Psych PSYCHIATRIC DISORDERS Anxiety Depression negative neurological ROS     GI/Hepatic negative GI ROS, Neg liver ROS,   Endo/Other  negative endocrine ROS  Renal/GU negative Renal ROS     Musculoskeletal negative musculoskeletal ROS (+)   Abdominal   Peds  Hematology negative hematology ROS (+)   Anesthesia Other Findings   Reproductive/Obstetrics negative OB ROS                        Anesthesia Physical Anesthesia Plan  ASA: II  Anesthesia Plan: General   Post-op Pain Management:    Induction: Intravenous  Airway Management Planned: Oral ETT  Additional Equipment:   Intra-op Plan:   Post-operative Plan: Extubation in OR  Informed Consent: I have reviewed the patients History and Physical, chart, labs and discussed the procedure including the risks, benefits and alternatives for the proposed anesthesia with the patient or authorized representative who has indicated his/her understanding and acceptance.     Plan Discussed with: CRNA, Anesthesiologist and Surgeon  Anesthesia Plan Comments:         Anesthesia Quick Evaluation

## 2012-10-23 ENCOUNTER — Encounter (HOSPITAL_COMMUNITY): Payer: Self-pay | Admitting: *Deleted

## 2012-10-23 LAB — BASIC METABOLIC PANEL
BUN: 8 mg/dL (ref 6–23)
CO2: 31 mEq/L (ref 19–32)
Chloride: 98 mEq/L (ref 96–112)
Creatinine, Ser: 0.75 mg/dL (ref 0.50–1.35)
Glucose, Bld: 108 mg/dL — ABNORMAL HIGH (ref 70–99)

## 2012-10-23 LAB — CBC
HCT: 40.8 % (ref 39.0–52.0)
Hemoglobin: 14.4 g/dL (ref 13.0–17.0)
MCV: 89.7 fL (ref 78.0–100.0)
Platelets: 179 10*3/uL (ref 150–400)
RBC: 4.55 MIL/uL (ref 4.22–5.81)
WBC: 12.4 10*3/uL — ABNORMAL HIGH (ref 4.0–10.5)

## 2012-10-23 MED ORDER — KETOROLAC TROMETHAMINE 30 MG/ML IJ SOLN
30.0000 mg | Freq: Four times a day (QID) | INTRAMUSCULAR | Status: AC
  Administered 2012-10-23 – 2012-10-27 (×16): 30 mg via INTRAVENOUS
  Filled 2012-10-23 (×19): qty 1

## 2012-10-23 MED ORDER — PHENOL 1.4 % MT LIQD: 1.0000 | OROMUCOSAL | Status: DC | PRN

## 2012-10-23 NOTE — Progress Notes (Signed)
Pt up to chair x 2hrs tolerated well. Using IS 1500.Marland Kitcheninstructed to use frequently. Up and down in room frequently. Fayrene Fearing [facility]

## 2012-10-23 NOTE — Progress Notes (Signed)
Blood is noted in NGT will continue to monitor Ilean Skill LPN

## 2012-10-23 NOTE — Progress Notes (Signed)
Orthopedic Tech Progress Note Patient Details:  [name] 08/06/1990 [id number]  Ortho Devices Type of Ortho Device: Abdominal binder Ortho Device/Splint Location: abdomen Ortho Device/Splint Interventions: Ordered rn to apply; rn notified  Nikki Dom 10/23/2012, 6:18 PM

## 2012-10-23 NOTE — Progress Notes (Signed)
1 Day Post-Op  Subjective: More comfortable with basal rate on PCA No nausea NG tube 300 cc output - some blood in tubing JP - serosanguinous output Foley out - has not voided yet (1 hour)  Objective: Vital signs in last 24 hours: Temp:  [97.6 F (36.4 C)-98.7 F (37.1 C)] 97.6 F (36.4 C) (04/09 0558) Pulse Rate:  [75-96] 82 (04/09 0558) Resp:  [8-18] 18 (04/09 0814) BP: (122-163)/(58-99) 124/58 mmHg (04/09 0558) SpO2:  [90 %-100 %] 100 % (04/09 0814) FiO2 (%):  [94 %-100 %] 94 % (04/09 0322) Last BM Date: 10/22/12  Intake/Output from previous day: 04/08 0701 - 04/09 0700 In: 2736.7 [I.V.:2686.7; IV Piggyback:50] Out: 1405 [Urine:915; Emesis/NG output:300; Drains:110; Blood:80] Intake/Output this shift: Total I/O In: -  Out: 300 [Emesis/NG output:250; Drains:50]  General appearance: alert, cooperative and no distress Resp: clear to auscultation bilaterally GI: quiet, non-distended Staple lines c/d/i through dressings  Lab Results:   Recent Labs  10/23/12 0445  WBC 12.4*  HGB 14.4  HCT 40.8  PLT 179   BMET  Recent Labs  10/23/12 0445  NA 135  K 3.8  CL 98  CO2 31  GLUCOSE 108*  BUN 8  CREATININE 0.75  CALCIUM 8.3*   PT/INR No results found for this basename: LABPROT, INR,  in the last 72 hours ABG No results found for this basename: PHART, PCO2, PO2, HCO3,  in the last 72 hours  Studies/Results: No results found.  Anti-infectives: Anti-infectives   Start     Dose/Rate Route Frequency Ordered Stop   10/23/12 0700  ertapenem (INVANZ) 1 g in sodium chloride 0.9 % 50 mL IVPB     1 g 100 mL/hr over 30 Minutes Intravenous Every 24 hours 10/22/12 1310 10/23/12 0659   10/22/12 0600  ertapenem (INVANZ) 1 g in sodium chloride 0.9 % 50 mL IVPB     1 g 100 mL/hr over 30 Minutes Intravenous On call to O.R. 10/21/12 1455 10/22/12 0710      Assessment/Plan: s/p Procedure(s): EXPLORATORY LAPAROTOMY ileostomy takedown ventral hernia  (N/A) Encourage  ambulation Ice chips until bowel function - continue NG tube Invanz x 24 hours Incentive spirometer  Leave basal rate one more day   LOS: 1 day    Rubee Vega K. 10/23/2012

## 2012-10-24 LAB — CBC
HCT: 33.9 % — ABNORMAL LOW (ref 39.0–52.0)
MCH: 31.7 pg (ref 26.0–34.0)
MCV: 88.7 fL (ref 78.0–100.0)
RDW: 12.6 % (ref 11.5–15.5)
WBC: 8.8 10*3/uL (ref 4.0–10.5)

## 2012-10-24 LAB — BASIC METABOLIC PANEL
BUN: 6 mg/dL (ref 6–23)
Chloride: 99 mEq/L (ref 96–112)
Creatinine, Ser: 0.78 mg/dL (ref 0.50–1.35)
GFR calc Af Amer: >90 mL/min (ref >90)

## 2012-10-24 MED ORDER — METHOCARBAMOL 100 MG/ML IJ SOLN
1000.0000 mg | Freq: Three times a day (TID) | INTRAMUSCULAR | Status: DC | PRN
  Filled 2012-10-24: qty 10

## 2012-10-24 MED ORDER — LORAZEPAM 2 MG/ML IJ SOLN
INTRAMUSCULAR | Status: AC
  Administered 2012-10-24: 1 mg via INTRAVENOUS
  Filled 2012-10-24: qty 1

## 2012-10-24 MED ORDER — HYDROMORPHONE 0.3 MG/ML IV SOLN
INTRAVENOUS | Status: DC
  Administered 2012-10-24 (×2): via INTRAVENOUS
  Administered 2012-10-24: 2.33 mg via INTRAVENOUS
  Administered 2012-10-25: 2.85 mg via INTRAVENOUS
  Administered 2012-10-25: 3 mg via INTRAVENOUS
  Administered 2012-10-25: 12:00:00 via INTRAVENOUS
  Administered 2012-10-26: 3.9 mg via INTRAVENOUS
  Administered 2012-10-26: 1.5 mg via INTRAVENOUS
  Administered 2012-10-26: 2.5 mg via INTRAVENOUS
  Administered 2012-10-26: 0.9 mg via INTRAVENOUS
  Administered 2012-10-26: 2.5 mg via INTRAVENOUS
  Administered 2012-10-26: 12:00:00 via INTRAVENOUS
  Administered 2012-10-26: 1.2 mg via INTRAVENOUS
  Administered 2012-10-26: 1.65 mg via INTRAVENOUS
  Administered 2012-10-27: 1.8 mg via INTRAVENOUS
  Administered 2012-10-27: 2.1 mg via INTRAVENOUS
  Administered 2012-10-27: 2.89 mg via INTRAVENOUS
  Administered 2012-10-27: 17:00:00 via INTRAVENOUS
  Administered 2012-10-27: 1.2 mg via INTRAVENOUS
  Administered 2012-10-27 – 2012-10-28 (×2): 0.9 mg via INTRAVENOUS
  Administered 2012-10-28: 0.3 mg via INTRAVENOUS
  Administered 2012-10-28: 1.31 mg via INTRAVENOUS
  Filled 2012-10-24 (×8): qty 25

## 2012-10-24 MED ORDER — OXYMETAZOLINE HCL 0.05 % NA SOLN
1.0000 | Freq: Two times a day (BID) | NASAL | Status: DC
  Administered 2012-10-24 – 2012-10-27 (×5): 1 via NASAL
  Filled 2012-10-24: qty 15

## 2012-10-24 MED ORDER — LORAZEPAM 2 MG/ML IJ SOLN
1.0000 mg | INTRAMUSCULAR | Status: DC | PRN
  Administered 2012-10-24 – 2012-10-29 (×19): 1 mg via INTRAVENOUS
  Filled 2012-10-24 (×17): qty 1

## 2012-10-24 NOTE — Progress Notes (Signed)
2 Days Post-Op  Subjective: Patient seems to be more comfortable - likes abdominal binder No flatus or bowel movements Still with some bloody NG output  Objective: Vital signs in last 24 hours: Temp:  [97.4 F (36.3 C)-98.8 F (37.1 C)] 98.8 F (37.1 C) (04/10 0517) Pulse Rate:  [74-85] 85 (04/10 0517) Resp:  [10-22] 14 (04/10 0517) BP: (106-137)/(52-78) 117/59 mmHg (04/10 0517) SpO2:  [97 %-100 %] 98 % (04/10 0517) Weight:  [149 lb 14.6 oz (68 kg)] 149 lb 14.6 oz (68 kg) (04/09 1100) Last BM Date: 10/22/12  Intake/Output from previous day: 04/09 0701 - 04/10 0700 In: 1290 [P.O.:60; I.V.:1200; NG/GT:30] Out: 2520 [Urine:1500; Emesis/NG output:825; Drains:195] Intake/Output this shift:    General appearance: alert, cooperative and no distress Resp: clear to auscultation bilaterally Cardio: regular rate and rhythm, S1, S2 normal, no murmur, click, rub or gallop GI: minimal bowel sounds; mildly distended Incisions c/d/i through dressing; RLQ drain - serosanguinous  Lab Results:   Recent Labs  10/23/12 0445 10/24/12 0540  WBC 12.4* 8.8  HGB 14.4 12.1*  HCT 40.8 33.9*  PLT 179 154   BMET  Recent Labs  10/23/12 0445 10/24/12 0540  NA 135 135  K 3.8 3.7  CL 98 99  CO2 31 32  GLUCOSE 108* 87  BUN 8 6  CREATININE 0.75 0.78  CALCIUM 8.3* 8.5   PT/INR No results found for this basename: LABPROT, INR,  in the last 72 hours ABG No results found for this basename: PHART, PCO2, PO2, HCO3,  in the last 72 hours  Studies/Results: No results found.  Anti-infectives: Anti-infectives   Start     Dose/Rate Route Frequency Ordered Stop   10/23/12 0700  ertapenem (INVANZ) 1 g in sodium chloride 0.9 % 50 mL IVPB     1 g 100 mL/hr over 30 Minutes Intravenous Every 24 hours 10/22/12 1310 10/23/12 0659   10/22/12 0600  ertapenem (INVANZ) 1 g in sodium chloride 0.9 % 50 mL IVPB     1 g 100 mL/hr over 30 Minutes Intravenous On call to O.R. 10/21/12 1455 10/22/12 0710       Assessment/Plan: s/p Procedure(s): EXPLORATORY LAPAROTOMY ileostomy takedown ventral hernia  (N/A) Encourage ambulation D/C basal rate on PCA Awaiting bowel function - continue NG tube ice chips   LOS: 2 days    Nathaniel Daum K. 10/24/2012

## 2012-10-25 MED ORDER — NICOTINE 21 MG/24HR TD PT24
21.0000 mg | MEDICATED_PATCH | Freq: Every day | TRANSDERMAL | Status: DC
  Administered 2012-10-25 – 2012-10-29 (×5): 21 mg via TRANSDERMAL
  Filled 2012-10-25 (×5): qty 1

## 2012-10-25 NOTE — Progress Notes (Signed)
No bowel movement or flatus.  Good bowel sounds.  This patient has been seen and I agree with the findings and treatment plan.  Marta Lamas. Gae Bon, MD, FACS [phone] (pager) [phone] (direct pager) Trauma Surgeon

## 2012-10-25 NOTE — Progress Notes (Signed)
Patient ID: HESTER FORGET, male   DOB: 01-05-1991, 22 y.o.   MRN: [id number]   LOS: 3 days  POD#3  Subjective: Denies flatus/N/V.   Objective: Vital signs in last 24 hours: Temp:  [97.9 F (36.6 C)-99.4 F (37.4 C)] 97.9 F (36.6 C) (04/11 0500) Pulse Rate:  [76-100] 100 (04/11 0500) Resp:  [7-18] 12 (04/11 0500) BP: (103-154)/(48-82) 119/82 mmHg (04/11 0500) SpO2:  [96 %-100 %] 100 % (04/11 0500) Last BM Date: 10/22/12   NGT: 1260ml/24h  JP: 105/24h   Physical Exam General appearance: alert and no distress Resp: clear to auscultation bilaterally Cardio: regular rate and rhythm GI: Soft, scant BS, incision clean.   Assessment/Plan: S/p ileostomy takedown, VHR Ileus -- Continue NGT Chronic pain -- Continue PCA FEN -- No issues VTE -- SCD's, Lovenox Dispo -- Ileus   Freeman Caldron, PA-C Pager: [phone] General Trauma PA Pager: [phone]   10/25/2012

## 2012-10-25 NOTE — Progress Notes (Signed)
UR completed 

## 2012-10-26 MED ORDER — HYDROXYZINE HCL 10 MG/5ML PO SYRP: 10.0000 mg | ORAL_SOLUTION | Freq: Three times a day (TID) | ORAL | Status: DC

## 2012-10-26 MED ORDER — HYDROXYZINE HCL 50 MG/ML IM SOLN
25.0000 mg | Freq: Once | INTRAMUSCULAR | Status: DC
  Filled 2012-10-26: qty 0.5

## 2012-10-26 MED ORDER — HYDROXYZINE HCL 50 MG/ML IM SOLN: 25.0000 mg | Freq: Once | INTRAMUSCULAR | Status: DC

## 2012-10-26 NOTE — Progress Notes (Signed)
4 Days Post-Op  Subjective: No flatus or BM  Objective: Vital signs in last 24 hours: Temp:  [97.5 F (36.4 C)-98.9 F (37.2 C)] 98.2 F (36.8 C) (04/12 0631) Pulse Rate:  [71-88] 75 (04/12 0631) Resp:  [9-16] 12 (04/12 0631) BP: (103-119)/(57-64) 108/57 mmHg (04/12 0631) SpO2:  [96 %-100 %] 100 % (04/12 0631) FiO2 (%):  [94 %] 94 % (04/12 0400) Last BM Date: 10/22/12  Intake/Output from previous day: 04/11 0701 - 04/12 0700 In: 1780 [I.V.:1780] Out: 1555 [Urine:550; Emesis/NG output:950; Drains:55] Intake/Output this shift:    General appearance: alert, cooperative, no distress and sitting up at bedside Resp: nonlabored Cardio: normal rate, regular GI: soft, appropriate tenderness, binder in place,  JP ss output.  dressing in place but can see through it.  wound looks fine, no evidence of infection.  Lab Results:   Recent Labs  10/24/12 0540  WBC 8.8  HGB 12.1*  HCT 33.9*  PLT 154   BMET  Recent Labs  10/24/12 0540  NA 135  K 3.7  CL 99  CO2 32  GLUCOSE 87  BUN 6  CREATININE 0.78  CALCIUM 8.5   PT/INR No results found for this basename: LABPROT, INR,  in the last 72 hours ABG No results found for this basename: PHART, PCO2, PO2, HCO3,  in the last 72 hours  Studies/Results: No results found.  Anti-infectives: Anti-infectives   Start     Dose/Rate Route Frequency Ordered Stop   10/23/12 0700  ertapenem (INVANZ) 1 g in sodium chloride 0.9 % 50 mL IVPB     1 g 100 mL/hr over 30 Minutes Intravenous Every 24 hours 10/22/12 1310 10/23/12 0659   10/22/12 0600  ertapenem (INVANZ) 1 g in sodium chloride 0.9 % 50 mL IVPB     1 g 100 mL/hr over 30 Minutes Intravenous On call to O.R. 10/21/12 1455 10/22/12 0710      Assessment/Plan: s/p Procedure(s): EXPLORATORY LAPAROTOMY ileostomy takedown ventral hernia  (N/A) he looks fine.  awaiting return of bowel function.  leave NG until sign of bowel function.  LOS: 4 days    Lodema Pilot  DAVID 10/26/2012

## 2012-10-27 MED ORDER — PANTOPRAZOLE SODIUM 40 MG IV SOLR
40.0000 mg | Freq: Once | INTRAVENOUS | Status: AC
  Administered 2012-10-27: 40 mg via INTRAVENOUS
  Filled 2012-10-27: qty 40

## 2012-10-27 MED ORDER — WHITE PETROLATUM GEL
Status: AC
  Filled 2012-10-27: qty 5

## 2012-10-27 NOTE — Progress Notes (Signed)
5 Days Post-Op  Subjective: Feels hungry.  No flatus or BM yet.  Objective: Vital signs in last 24 hours: Temp:  [97.8 F (36.6 C)-99.2 F (37.3 C)] 99.2 F (37.3 C) (04/13 0536) Pulse Rate:  [79-106] 87 (04/13 0536) Resp:  [10-18] 18 (04/13 0800) BP: (101-128)/(49-65) 108/62 mmHg (04/13 0536) SpO2:  [96 %-100 %] 97 % (04/13 0800) Last BM Date:  (prior to admission)  Intake/Output from previous day: 04/12 0701 - 04/13 0700 In: 2488.3 [I.V.:2478.3] Out: 2490 [Urine:1650; Emesis/NG output:800; Drains:40] Intake/Output this shift:    General appearance: alert, cooperative and no distress Resp: clear to auscultation bilaterally Cardio: normal rate, regular GI: soft, mild incisional tenderness, ND, wound looks okay, he has some bruising on right side of lower wound but no erythema or warmth or drainage, JP ss, +BS  Lab Results:  No results found for this basename: WBC, HGB, HCT, PLT,  in the last 72 hours BMET No results found for this basename: NA, K, CL, CO2, GLUCOSE, BUN, CREATININE, CALCIUM,  in the last 72 hours PT/INR No results found for this basename: LABPROT, INR,  in the last 72 hours ABG No results found for this basename: PHART, PCO2, PO2, HCO3,  in the last 72 hours  Studies/Results: No results found.  Anti-infectives: Anti-infectives   Start     Dose/Rate Route Frequency Ordered Stop   10/23/12 0700  ertapenem (INVANZ) 1 g in sodium chloride 0.9 % 50 mL IVPB     1 g 100 mL/hr over 30 Minutes Intravenous Every 24 hours 10/22/12 1310 10/23/12 0659   10/22/12 0600  ertapenem (INVANZ) 1 g in sodium chloride 0.9 % 50 mL IVPB     1 g 100 mL/hr over 30 Minutes Intravenous On call to O.R. 10/21/12 1455 10/22/12 0710      Assessment/Plan: s/p Procedure(s): EXPLORATORY LAPAROTOMY ileostomy takedown ventral hernia  (N/A) He has some BS but no BM yet.  will try to clamp NG and if does okay with this clamped, will give sips of clears later today. continue to  mobilize  LOS: 5 days    Lodema Pilot DAVID 10/27/2012

## 2012-10-28 DIAGNOSIS — G8921 Chronic pain due to trauma: Secondary | ICD-10-CM | POA: Diagnosis present

## 2012-10-28 MED ORDER — TRAMADOL HCL 50 MG PO TABS
100.0000 mg | ORAL_TABLET | Freq: Four times a day (QID) | ORAL | Status: DC
  Administered 2012-10-28 – 2012-10-29 (×6): 100 mg via ORAL
  Filled 2012-10-28 (×10): qty 2

## 2012-10-28 MED ORDER — NAPROXEN 500 MG PO TABS
500.0000 mg | ORAL_TABLET | Freq: Two times a day (BID) | ORAL | Status: DC
  Administered 2012-10-28 – 2012-10-29 (×2): 500 mg via ORAL
  Filled 2012-10-28 (×4): qty 1

## 2012-10-28 MED ORDER — DIPHENHYDRAMINE HCL 25 MG PO CAPS
25.0000 mg | ORAL_CAPSULE | Freq: Four times a day (QID) | ORAL | Status: DC | PRN
  Administered 2012-10-28: 25 mg via ORAL
  Filled 2012-10-28: qty 1

## 2012-10-28 MED ORDER — HYDROMORPHONE HCL PF 1 MG/ML IJ SOLN
1.0000 mg | INTRAMUSCULAR | Status: DC | PRN
  Administered 2012-10-28 (×2): 1 mg via INTRAVENOUS
  Filled 2012-10-28 (×2): qty 1

## 2012-10-28 MED ORDER — OXYCODONE HCL 5 MG PO TABS
10.0000 mg | ORAL_TABLET | ORAL | Status: DC | PRN
  Administered 2012-10-28 – 2012-10-29 (×4): 10 mg via ORAL
  Administered 2012-10-29: 20 mg via ORAL
  Filled 2012-10-28: qty 4
  Filled 2012-10-28 (×4): qty 2
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
## 2012-10-28 NOTE — Progress Notes (Signed)
Patient ID: HEZZIE KARIM, male   DOB: [birth year], 22 y.o.   MRN: [id number]   LOS: 6 days  POD#6  Subjective: BM x2, No N/V. NGT got caught on bedrail this am and partially pulled out so RN removed. Mom wanted to see SW about various issues   Objective: Vital signs in last 24 hours: Temp:  [98.1 F (36.7 C)-98.6 F (37 C)] 98.5 F (36.9 C) (04/14 0607) Pulse Rate:  [70-105] 89 (04/14 0607) Resp:  [10-18] 18 (04/14 0607) BP: (119-139)/(58-80) 135/72 mmHg (04/14 0607) SpO2:  [96 %-100 %] 97 % (04/14 0607) Last BM Date:  (pre op)   JP: 64ml/24h   Physical Exam General appearance: alert and no distress Resp: clear to auscultation bilaterally Cardio: regular rate and rhythm GI: Soft, +BS, incision clean and intact   Assessment/Plan: S/p ileostomy takedown, VHR  Ileus -- Will give clears, should be able to advance quickly Chronic pain -- D/C PCA FEN -- As above. Based on PCA usage should be able to control pain with oxyIR + tramadol + NSAID VTE -- SCD's, Lovenox  Dispo -- Ileus    Freeman Caldron, PA-C Pager: [phone] General Trauma PA Pager: [phone]   10/28/2012

## 2012-10-28 NOTE — Progress Notes (Signed)
UR completed 

## 2012-10-28 NOTE — Progress Notes (Signed)
Oral pain meds working well. Tolerating clears.  He requests PTSD referral at D/C. Patient examined and I agree with the assessment and plan  Violeta Gelinas, MD, MPH, FACS Pager: [phone]  10/28/2012 3:51 PM

## 2012-10-29 DIAGNOSIS — F4310 Post-traumatic stress disorder, unspecified: Secondary | ICD-10-CM | POA: Insufficient documentation

## 2012-10-29 LAB — BASIC METABOLIC PANEL
BUN: 8 mg/dL (ref 6–23)
Chloride: 99 mEq/L (ref 96–112)
GFR calc Af Amer: >90 mL/min (ref >90)
GFR calc non Af Amer: >90 mL/min (ref >90)
Potassium: 4 mEq/L (ref 3.5–5.1)
Sodium: 138 mEq/L (ref 135–145)

## 2012-10-29 LAB — CBC
HCT: 37.2 % — ABNORMAL LOW (ref 39.0–52.0)
Hemoglobin: 13.7 g/dL (ref 13.0–17.0)
MCHC: 36.8 g/dL — ABNORMAL HIGH (ref 30.0–36.0)
RDW: 12 % (ref 11.5–15.5)
WBC: 8.5 10*3/uL (ref 4.0–10.5)

## 2012-10-29 MED ORDER — TRAMADOL HCL 50 MG PO TABS: 100.0000 mg | ORAL_TABLET | Freq: Four times a day (QID) | ORAL | Status: DC

## 2012-10-29 MED ORDER — LORAZEPAM 0.5 MG PO TABS
0.5000 mg | ORAL_TABLET | Freq: Four times a day (QID) | ORAL | Status: DC | PRN
  Administered 2012-10-29: 0.5 mg via ORAL
  Filled 2012-10-29: qty 1

## 2012-10-29 MED ORDER — OXYCODONE-ACETAMINOPHEN 10-325 MG PO TABS: 1.0000 | ORAL_TABLET | ORAL | Status: DC | PRN

## 2012-10-29 MED ORDER — NAPROXEN 500 MG PO TABS: 500.0000 mg | ORAL_TABLET | Freq: Two times a day (BID) | ORAL | Status: DC

## 2012-10-29 MED ORDER — LORAZEPAM 0.5 MG PO TABS: 0.5000 mg | ORAL_TABLET | Freq: Four times a day (QID) | ORAL | Status: DC | PRN

## 2012-10-29 NOTE — Discharge Summary (Signed)
Bretton Tandy, MD, MPH, FACS Pager: [phone]  

## 2012-10-29 NOTE — Progress Notes (Signed)
Patient ID: [name], male   DOB: 1991/03/14, 22 y.o.   MRN: [id number]   LOS: 7 days   Subjective: No N/V with fulls last night. Is having frequent (~5x/day) diarrhea.   Objective: Vital signs in last 24 hours: Temp:  [97.5 F (36.4 C)-98.7 F (37.1 C)] 97.5 F (36.4 C) (04/15 0519) Pulse Rate:  [69-92] 69 (04/15 0519) Resp:  [15-20] 16 (04/15 0519) BP: (104-134)/(53-72) 107/60 mmHg (04/15 0519) SpO2:  [92 %-100 %] 99 % (04/15 0519) Last BM Date: 10/28/12   JP: 1ml/24h   Physical Exam General appearance: alert and no distress Resp: clear to auscultation bilaterally Cardio: regular rate and rhythm GI: normal findings: bowel sounds normal and soft, incisions C/D/I   Assessment/Plan: S/p ileostomy takedown, VHR  Ileus -- Give reg diet for breakfast Chronic pain -- Appears controlled on current oral regimen FEN -- Check CBC and BMET with diarrhea VTE -- SCD's, Lovenox  Dispo -- Home this afternoon if tolerates reg diet and labs ok.    Freeman Caldron, PA-C Pager: [phone] General Trauma PA Pager: [phone]   10/29/2012
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
## 2012-10-29 NOTE — Progress Notes (Signed)
Patient discharged to home with instructions, verbalized understanding. 

## 2012-10-29 NOTE — Progress Notes (Signed)
Likely home later today.  D/C JP prior to D/C Patient examined and I agree with the assessment and plan  Violeta Gelinas, MD, MPH, FACS Pager: [phone]  10/29/2012 1:30 PM

## 2012-10-31 ENCOUNTER — Telehealth (INDEPENDENT_AMBULATORY_CARE_PROVIDER_SITE_OTHER): Payer: Self-pay | Admitting: General Surgery

## 2012-10-31 ENCOUNTER — Telehealth (INDEPENDENT_AMBULATORY_CARE_PROVIDER_SITE_OTHER): Payer: Self-pay | Admitting: *Deleted

## 2012-10-31 NOTE — Telephone Encounter (Signed)
Called Mr Kraynak back this morning and told him that he needed to do what Baxter Hire had stated in her phone message about taking fiber supplement and do a high fiber diet, he wanted to know how long will he have the multiple loose stool. I told him that everybody is different and it could last for several days that I could not give a day that it will stop. I told him that Dr Corliss Skains has the same message and if he need to call back up here he if had a any more questions he could and ask for [facility]

## 2012-10-31 NOTE — Telephone Encounter (Signed)
Patient called to state that he is having multiple loose stools since surgery.  Patient encouraged to eat a high fiber diet and he can even use a fiber supplement if he feels necessary to try to help to bulk up the stool.  Explained to patient that the body is going through a readjustment and it will take some time.  Patient encouraged to call back if it continues for more than 2-3 weeks. Patient asked for message to be sent to Dr. Corliss Skains and asked for a personal call back.  Explained to patient that the message will be passed onto Dr. Corliss Skains however in most cases they communicate through the nurses however that would be up to the doctor.  Patient states understanding.

## 2012-11-04 ENCOUNTER — Emergency Department (HOSPITAL_COMMUNITY)
Admission: EM | Admit: 2012-11-04 | Discharge: 2012-11-05 | Disposition: A | Discharge status: Home / Self Care | Payer: BC Managed Care – PPO | Attending: Emergency Medicine | Admitting: Emergency Medicine

## 2012-11-04 ENCOUNTER — Telehealth (INDEPENDENT_AMBULATORY_CARE_PROVIDER_SITE_OTHER): Payer: Self-pay | Admitting: General Surgery

## 2012-11-04 ENCOUNTER — Encounter (HOSPITAL_COMMUNITY): Payer: Self-pay

## 2012-11-04 ENCOUNTER — Encounter (INDEPENDENT_AMBULATORY_CARE_PROVIDER_SITE_OTHER): Payer: Self-pay | Admitting: Orthopedic Surgery

## 2012-11-04 ENCOUNTER — Emergency Department (HOSPITAL_COMMUNITY): Payer: BC Managed Care – PPO

## 2012-11-04 DIAGNOSIS — Z87828 Personal history of other (healed) physical injury and trauma: Secondary | ICD-10-CM | POA: Insufficient documentation

## 2012-11-04 DIAGNOSIS — F17200 Nicotine dependence, unspecified, uncomplicated: Secondary | ICD-10-CM | POA: Insufficient documentation

## 2012-11-04 DIAGNOSIS — Z8739 Personal history of other diseases of the musculoskeletal system and connective tissue: Secondary | ICD-10-CM | POA: Insufficient documentation

## 2012-11-04 DIAGNOSIS — F913 Oppositional defiant disorder: Secondary | ICD-10-CM | POA: Insufficient documentation

## 2012-11-04 DIAGNOSIS — F329 Major depressive disorder, single episode, unspecified: Secondary | ICD-10-CM | POA: Insufficient documentation

## 2012-11-04 DIAGNOSIS — F411 Generalized anxiety disorder: Secondary | ICD-10-CM | POA: Insufficient documentation

## 2012-11-04 DIAGNOSIS — Z8639 Personal history of other endocrine, nutritional and metabolic disease: Secondary | ICD-10-CM | POA: Insufficient documentation

## 2012-11-04 DIAGNOSIS — Z932 Ileostomy status: Secondary | ICD-10-CM | POA: Insufficient documentation

## 2012-11-04 DIAGNOSIS — Z79899 Other long term (current) drug therapy: Secondary | ICD-10-CM | POA: Insufficient documentation

## 2012-11-04 DIAGNOSIS — R109 Unspecified abdominal pain: Secondary | ICD-10-CM | POA: Insufficient documentation

## 2012-11-04 DIAGNOSIS — F3289 Other specified depressive episodes: Secondary | ICD-10-CM | POA: Insufficient documentation

## 2012-11-04 DIAGNOSIS — Z862 Personal history of diseases of the blood and blood-forming organs and certain disorders involving the immune mechanism: Secondary | ICD-10-CM | POA: Insufficient documentation

## 2012-11-04 DIAGNOSIS — R231 Pallor: Secondary | ICD-10-CM | POA: Insufficient documentation

## 2012-11-04 DIAGNOSIS — Z933 Colostomy status: Secondary | ICD-10-CM | POA: Insufficient documentation

## 2012-11-04 DIAGNOSIS — R197 Diarrhea, unspecified: Secondary | ICD-10-CM | POA: Insufficient documentation

## 2012-11-04 DIAGNOSIS — Z9049 Acquired absence of other specified parts of digestive tract: Secondary | ICD-10-CM | POA: Insufficient documentation

## 2012-11-04 LAB — URINALYSIS, ROUTINE W REFLEX MICROSCOPIC
Glucose, UA: NEGATIVE mg/dL
Leukocytes, UA: NEGATIVE
Protein, ur: NEGATIVE mg/dL
Specific Gravity, Urine: 1.038 — ABNORMAL HIGH (ref 1.005–1.030)
pH: 6 (ref 5.0–8.0)

## 2012-11-04 LAB — COMPREHENSIVE METABOLIC PANEL
Albumin: 3.8 g/dL (ref 3.5–5.2)
BUN: 10 mg/dL (ref 6–23)
CO2: 29 mEq/L (ref 19–32)
Calcium: 9.3 mg/dL (ref 8.4–10.5)
Chloride: 100 mEq/L (ref 96–112)
Creatinine, Ser: 0.76 mg/dL (ref 0.50–1.35)
GFR calc non Af Amer: >90 mL/min (ref >90)
Total Bilirubin: 0.3 mg/dL (ref 0.3–1.2)

## 2012-11-04 LAB — CBC WITH DIFFERENTIAL/PLATELET
Hemoglobin: 12.8 g/dL — ABNORMAL LOW (ref 13.0–17.0)
Lymphocytes Relative: 30 % (ref 12–46)
Lymphs Abs: 2.6 10*3/uL (ref 0.7–4.0)
Monocytes Relative: 6 % (ref 3–12)
Neutro Abs: 5 10*3/uL (ref 1.7–7.7)
Neutrophils Relative %: 59 % (ref 43–77)
Platelets: 343 10*3/uL (ref 150–400)
RBC: 4.13 MIL/uL — ABNORMAL LOW (ref 4.22–5.81)
WBC: 8.5 10*3/uL (ref 4.0–10.5)

## 2012-11-04 LAB — LIPASE, BLOOD: Lipase: 87 U/L — ABNORMAL HIGH (ref 11–59)

## 2012-11-04 MED ORDER — SODIUM CHLORIDE 0.9 % IV BOLUS (SEPSIS)
1000.0000 mL | Freq: Once | INTRAVENOUS | Status: AC
  Administered 2012-11-04: 1000 mL via INTRAVENOUS

## 2012-11-04 MED ORDER — SODIUM CHLORIDE 0.9 % IV BOLUS (SEPSIS): 1000.0000 mL | Freq: Once | INTRAVENOUS | Status: DC

## 2012-11-04 MED ORDER — HYDROMORPHONE HCL PF 1 MG/ML IJ SOLN
1.0000 mg | Freq: Once | INTRAMUSCULAR | Status: AC
  Administered 2012-11-04: 1 mg via INTRAVENOUS
  Filled 2012-11-04: qty 1
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
## 2012-11-04 NOTE — ED Notes (Signed)
Pt informed that we need urine sample.  Left call bell with pt so he can call when he has to go.

## 2012-11-04 NOTE — Telephone Encounter (Signed)
Patient called and stated that he is not feeling well that he thinks that he is running a fever and having chills and he is having several bowel movement and it hurts when he drinks. I talked to Dr Corliss Skains in the clinic and he stated that he needed to go to the [facility] ER and have him checked out
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
## 2012-11-05 MED ORDER — LOPERAMIDE HCL 2 MG PO CAPS: 2.0000 mg | ORAL_CAPSULE | ORAL | Status: DC | PRN

## 2012-11-08 ENCOUNTER — Encounter (INDEPENDENT_AMBULATORY_CARE_PROVIDER_SITE_OTHER): Payer: BC Managed Care – PPO

## 2012-11-08 ENCOUNTER — Ambulatory Visit (INDEPENDENT_AMBULATORY_CARE_PROVIDER_SITE_OTHER): Payer: BC Managed Care – PPO | Admitting: Surgery

## 2012-11-08 ENCOUNTER — Encounter (INDEPENDENT_AMBULATORY_CARE_PROVIDER_SITE_OTHER): Payer: Self-pay | Admitting: Surgery

## 2012-11-08 VITALS — BP 122/73 | HR 70 | Temp 98.1°F | Resp 14 | Ht 69.0 in | Wt 150.4 lb

## 2012-11-08 DIAGNOSIS — Z8719 Personal history of other diseases of the digestive system: Secondary | ICD-10-CM

## 2012-11-08 DIAGNOSIS — K439 Ventral hernia without obstruction or gangrene: Secondary | ICD-10-CM

## 2012-11-08 DIAGNOSIS — IMO0002 Reserved for concepts with insufficient information to code with codable children: Secondary | ICD-10-CM

## 2012-11-08 MED ORDER — OXYCODONE-ACETAMINOPHEN 5-325 MG PO TABS: 1.0000 | ORAL_TABLET | ORAL | Status: DC | PRN
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
## 2012-11-09 ENCOUNTER — Telehealth (INDEPENDENT_AMBULATORY_CARE_PROVIDER_SITE_OTHER): Payer: Self-pay | Admitting: General Surgery

## 2012-11-09 NOTE — Telephone Encounter (Signed)
His mother called stating that he was having significant diarrhea again and severe perianal discomfort because of it.  Has not taken his Imodium recently.  I told her to have him start the Imodium again with the goal of  5 or less BMs per day.  I recommended he apply Desitin with Aloe to the area as well.  If the diarrhea does not stop despite the above measures, I told her to bring him to the ED.

## 2012-11-18 ENCOUNTER — Encounter (HOSPITAL_BASED_OUTPATIENT_CLINIC_OR_DEPARTMENT_OTHER): Payer: BC Managed Care – PPO | Attending: Plastic Surgery

## 2012-11-18 ENCOUNTER — Telehealth (INDEPENDENT_AMBULATORY_CARE_PROVIDER_SITE_OTHER): Payer: Self-pay

## 2012-11-18 DIAGNOSIS — M79609 Pain in unspecified limb: Secondary | ICD-10-CM | POA: Insufficient documentation

## 2012-11-18 DIAGNOSIS — W3400XA Accidental discharge from unspecified firearms or gun, initial encounter: Secondary | ICD-10-CM | POA: Insufficient documentation

## 2012-11-18 DIAGNOSIS — S3690XA Unspecified injury of unspecified intra-abdominal organ, initial encounter: Secondary | ICD-10-CM | POA: Insufficient documentation

## 2012-11-18 DIAGNOSIS — S3790XA Unspecified injury of unspecified urinary and pelvic organ, initial encounter: Secondary | ICD-10-CM | POA: Insufficient documentation

## 2012-11-18 NOTE — Telephone Encounter (Signed)
Patient mother called stating that he was is still having diarrhea and severe perianal discomfort because of it. He's currently taking Imodium every 4-6 hours and applying Desitin as directed by Dr. Abbey Chatters per telephone encounter on 11/09/12.  Offered appointment in Urgent Office but, mother unable to bring him in to the office today.  Patient also, request a refill on his Oxycodone.  Refill request will be sent to Urgent Office (Dr. Maisie Fus). Patient s/p Ileostomy takedown w/ventral hernia 10/22/12

## 2012-11-18 NOTE — Telephone Encounter (Signed)
I called and left a message for the mom.  Dr Maisie Fus wrote Percocet 5/325 1 tab po q 6hrs prn pain # 30 no refill.  I will leave it up front for pick up

## 2012-11-19 NOTE — Telephone Encounter (Signed)
Spoke to Tsuei MD regarding patient's complaints.  Tsuei MD is aware of symptoms and states that they are to be expected with the surgery that this patient had.  Tsuei MD also approved patient for a refill of pain medication.  Tsuei MD suggested to continue same regimen at this time and be patient for symptoms to resolve.  Called and spoke to mother in a long conversation regarding symptoms.  Mother states patient is becoming very frustrated with how long symptoms are persisting.  Mother asked if this RN could call and reassure patient that symptoms are normal.  Attempted to call patient but no answer.

## 2012-11-19 NOTE — Telephone Encounter (Signed)
Prescription by Maisie Fus MD was voided at this time and prescription by Tsuei MD will be given to patient.  Percocet 5/325mg  1 tablet every 4 hours as needed for pain #40. No refills.
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
## 2012-11-26 ENCOUNTER — Encounter (INDEPENDENT_AMBULATORY_CARE_PROVIDER_SITE_OTHER): Payer: Self-pay | Admitting: Surgery

## 2012-11-26 ENCOUNTER — Ambulatory Visit (INDEPENDENT_AMBULATORY_CARE_PROVIDER_SITE_OTHER): Payer: BC Managed Care – PPO | Admitting: Surgery

## 2012-11-26 VITALS — BP 115/66 | HR 80 | Temp 98.0°F | Resp 16 | Ht 69.0 in | Wt 148.0 lb

## 2012-11-26 DIAGNOSIS — IMO0002 Reserved for concepts with insufficient information to code with codable children: Secondary | ICD-10-CM

## 2012-11-26 DIAGNOSIS — Z8719 Personal history of other diseases of the digestive system: Secondary | ICD-10-CM

## 2012-11-26 NOTE — Progress Notes (Signed)
The patient comes in for another postop visit. His abdominal pain has essentially resolved. His incision seems to be healing well. His appetite is improving.  He continues to have diarrhea 4 or 5 times a day. He has been using a generic form of Lomotil as well as a generic probiotic. He claims that the diarrhea leaves him with a lot of perianal itching. He still has some leg pain for which he is using some Ultram. He seems fairly depressed about his progress and his bowel movements. He is anxious to return to exercise.  Filed Vitals:   11/26/12 1630  BP: 115/66  Pulse: 80  Temp: 98 F (36.7 C)  Resp: 16   Abd - incision well-healed with no sign of infection or hernia.  Palpable suture knot in the incision. Old ileostomy site well-healed Perianal region - no sign of irritation or hemorrhoid  I spent some time with the patient and his mother discussing his progress. I think overall he is doing quite well. He is only 5 weeks out from surgery and his abdomen is completely healed. He is not having any significant pain from his abdomen. The right groin seems to be well scarred in with no sign of hernia at this time. He seems discouraged by his progress but I reminded him that he is actually doing quite well. He may begin exercising again next week.  His diarrhea seems to be slowing down. I encouraged him to use a different type of antidiarrheal medication to see if it worked better than his current regimen. He can use hydrocortisone ointment for the itching. He should eat a higher protein diet. We will recheck him in 2-3 weeks.  Wilmon Arms. Corliss Skains, MD, [facility]  General/ Trauma Surgery  11/26/2012 5:50 PM

## 2012-11-26 NOTE — Patient Instructions (Signed)
Try a different anti-diarrheal medication Probiotic - Florastor or closest generic equivalent 1% hydrocortisone ointment for itching

## 2012-12-19 ENCOUNTER — Emergency Department (HOSPITAL_COMMUNITY)
Admission: EM | Admit: 2012-12-19 | Discharge: 2012-12-19 | Disposition: A | Discharge status: Home / Self Care | Payer: BC Managed Care – PPO | Attending: Emergency Medicine | Admitting: Emergency Medicine

## 2012-12-19 ENCOUNTER — Other Ambulatory Visit (INDEPENDENT_AMBULATORY_CARE_PROVIDER_SITE_OTHER): Payer: Self-pay | Admitting: Surgery

## 2012-12-19 ENCOUNTER — Telehealth (INDEPENDENT_AMBULATORY_CARE_PROVIDER_SITE_OTHER): Payer: Self-pay | Admitting: General Surgery

## 2012-12-19 ENCOUNTER — Encounter (HOSPITAL_COMMUNITY): Payer: Self-pay | Admitting: Emergency Medicine

## 2012-12-19 DIAGNOSIS — K921 Melena: Secondary | ICD-10-CM | POA: Insufficient documentation

## 2012-12-19 DIAGNOSIS — Z9049 Acquired absence of other specified parts of digestive tract: Secondary | ICD-10-CM | POA: Insufficient documentation

## 2012-12-19 DIAGNOSIS — Z87828 Personal history of other (healed) physical injury and trauma: Secondary | ICD-10-CM | POA: Insufficient documentation

## 2012-12-19 DIAGNOSIS — K625 Hemorrhage of anus and rectum: Secondary | ICD-10-CM | POA: Insufficient documentation

## 2012-12-19 DIAGNOSIS — F17200 Nicotine dependence, unspecified, uncomplicated: Secondary | ICD-10-CM | POA: Insufficient documentation

## 2012-12-19 DIAGNOSIS — R197 Diarrhea, unspecified: Secondary | ICD-10-CM | POA: Insufficient documentation

## 2012-12-19 DIAGNOSIS — Z862 Personal history of diseases of the blood and blood-forming organs and certain disorders involving the immune mechanism: Secondary | ICD-10-CM | POA: Insufficient documentation

## 2012-12-19 DIAGNOSIS — Z9889 Other specified postprocedural states: Secondary | ICD-10-CM | POA: Insufficient documentation

## 2012-12-19 DIAGNOSIS — R1084 Generalized abdominal pain: Secondary | ICD-10-CM | POA: Insufficient documentation

## 2012-12-19 DIAGNOSIS — Z932 Ileostomy status: Secondary | ICD-10-CM | POA: Insufficient documentation

## 2012-12-19 DIAGNOSIS — Z8659 Personal history of other mental and behavioral disorders: Secondary | ICD-10-CM | POA: Insufficient documentation

## 2012-12-19 DIAGNOSIS — F411 Generalized anxiety disorder: Secondary | ICD-10-CM | POA: Insufficient documentation

## 2012-12-19 DIAGNOSIS — R630 Anorexia: Secondary | ICD-10-CM | POA: Insufficient documentation

## 2012-12-19 LAB — CBC WITH DIFFERENTIAL/PLATELET
Basophils Absolute: 0 10*3/uL (ref 0.0–0.1)
HCT: 39.5 % (ref 39.0–52.0)
Hemoglobin: 14.2 g/dL (ref 13.0–17.0)
Lymphocytes Relative: 28 % (ref 12–46)
Lymphs Abs: 2.9 10*3/uL (ref 0.7–4.0)
Monocytes Absolute: 0.8 10*3/uL (ref 0.1–1.0)
Neutro Abs: 6.3 10*3/uL (ref 1.7–7.7)
RBC: 4.51 MIL/uL (ref 4.22–5.81)
RDW: 12.7 % (ref 11.5–15.5)
WBC: 10.1 10*3/uL (ref 4.0–10.5)

## 2012-12-19 LAB — BASIC METABOLIC PANEL
CO2: 24 mEq/L (ref 19–32)
Chloride: 104 mEq/L (ref 96–112)
Creatinine, Ser: 0.66 mg/dL (ref 0.50–1.35)

## 2012-12-19 LAB — CLOSTRIDIUM DIFFICILE BY PCR: Toxigenic C. Difficile by PCR: NEGATIVE

## 2012-12-19 MED ORDER — CHOLESTYRAMINE 4 G PO PACK: 1.0000 | PACK | Freq: Three times a day (TID) | ORAL | Status: DC

## 2012-12-19 MED ORDER — SODIUM CHLORIDE 0.9 % IV BOLUS (SEPSIS)
1000.0000 mL | Freq: Once | INTRAVENOUS | Status: AC
  Administered 2012-12-19: 1000 mL via INTRAVENOUS

## 2012-12-19 MED ORDER — MORPHINE SULFATE 4 MG/ML IJ SOLN
4.0000 mg | Freq: Once | INTRAMUSCULAR | Status: AC
  Administered 2012-12-19: 4 mg via INTRAVENOUS
  Filled 2012-12-19: qty 1
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
## 2012-12-19 NOTE — ED Provider Notes (Signed)
Medical screening examination/treatment/procedure(s) were performed by non-physician practitioner and as supervising physician I was immediately available for consultation/collaboration.   Hurman Horn, MD 12/19/12 [phone]

## 2012-12-19 NOTE — Telephone Encounter (Signed)
Called patient mother to let her know that he has a Rx of Questran powder sent to his pharmacy and he has a apt to see Dr Dulce Sellar on 12-30-12 @ 4:15

## 2012-12-19 NOTE — ED Notes (Signed)
PT. REPORTS PERSISTENT DIARRHEA , BLOODY STOOLS , GENERALIZED ABDOMINAL PAIN / RECTAL PAIN FOR SEVERAL WEEKS.  DENIES NAUSEA OR VOMITTING .

## 2012-12-20 ENCOUNTER — Encounter (INDEPENDENT_AMBULATORY_CARE_PROVIDER_SITE_OTHER): Payer: BC Managed Care – PPO | Admitting: Surgery

## 2012-12-22 LAB — STOOL CULTURE

## 2012-12-23 ENCOUNTER — Telehealth (INDEPENDENT_AMBULATORY_CARE_PROVIDER_SITE_OTHER): Payer: Self-pay

## 2012-12-23 NOTE — ED Notes (Signed)
Post ED Visit - Positive Culture Follow-up  Culture report reviewed by antimicrobial stewardship pharmacist: []  Wes Dulaney, Pharm.D., BCPS [x]  Celedonio Miyamoto, Pharm.D., BCPS []  Georgina Pillion, Pharm.D., BCPS []  [place], [address].D., BCPS, AAHIVP []  Estella Husk, Pharm.D., BCPS, AAHIVP  Positive stool culture  no further patient follow-up is required at this time.  Larena Sox 12/23/2012, 4:40 PM

## 2012-12-23 NOTE — Telephone Encounter (Signed)
Mother called to verify Patient s appt 12/25/12@10 :21 with DR. Tsuei

## 2012-12-25 ENCOUNTER — Ambulatory Visit (INDEPENDENT_AMBULATORY_CARE_PROVIDER_SITE_OTHER): Payer: BC Managed Care – PPO | Admitting: Surgery

## 2012-12-25 ENCOUNTER — Encounter (INDEPENDENT_AMBULATORY_CARE_PROVIDER_SITE_OTHER): Payer: Self-pay | Admitting: Surgery

## 2012-12-25 VITALS — BP 128/80 | HR 95 | Temp 99.2°F | Resp 17 | Ht 69.0 in | Wt 142.6 lb

## 2012-12-25 DIAGNOSIS — IMO0002 Reserved for concepts with insufficient information to code with codable children: Secondary | ICD-10-CM

## 2012-12-25 DIAGNOSIS — Z8719 Personal history of other diseases of the digestive system: Secondary | ICD-10-CM
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
## 2013-01-15 ENCOUNTER — Telehealth (INDEPENDENT_AMBULATORY_CARE_PROVIDER_SITE_OTHER): Payer: Self-pay | Admitting: General Surgery

## 2013-01-15 ENCOUNTER — Emergency Department (HOSPITAL_COMMUNITY): Payer: BC Managed Care – PPO

## 2013-01-15 ENCOUNTER — Emergency Department (HOSPITAL_COMMUNITY)
Admission: EM | Admit: 2013-01-15 | Discharge: 2013-01-15 | Disposition: A | Discharge status: Home / Self Care | Payer: BC Managed Care – PPO | Attending: Emergency Medicine | Admitting: Emergency Medicine

## 2013-01-15 ENCOUNTER — Encounter (HOSPITAL_COMMUNITY): Payer: Self-pay | Admitting: *Deleted

## 2013-01-15 DIAGNOSIS — K625 Hemorrhage of anus and rectum: Secondary | ICD-10-CM | POA: Insufficient documentation

## 2013-01-15 DIAGNOSIS — Z87828 Personal history of other (healed) physical injury and trauma: Secondary | ICD-10-CM | POA: Insufficient documentation

## 2013-01-15 DIAGNOSIS — F17200 Nicotine dependence, unspecified, uncomplicated: Secondary | ICD-10-CM | POA: Insufficient documentation

## 2013-01-15 DIAGNOSIS — Z8659 Personal history of other mental and behavioral disorders: Secondary | ICD-10-CM | POA: Insufficient documentation

## 2013-01-15 DIAGNOSIS — Z79899 Other long term (current) drug therapy: Secondary | ICD-10-CM | POA: Insufficient documentation

## 2013-01-15 DIAGNOSIS — Z862 Personal history of diseases of the blood and blood-forming organs and certain disorders involving the immune mechanism: Secondary | ICD-10-CM | POA: Insufficient documentation

## 2013-01-15 DIAGNOSIS — K921 Melena: Secondary | ICD-10-CM | POA: Insufficient documentation

## 2013-01-15 DIAGNOSIS — K6289 Other specified diseases of anus and rectum: Secondary | ICD-10-CM | POA: Insufficient documentation

## 2013-01-15 LAB — BASIC METABOLIC PANEL
BUN: 10 mg/dL (ref 6–23)
CO2: 26 mEq/L (ref 19–32)
Calcium: 9.1 mg/dL (ref 8.4–10.5)
Creatinine, Ser: 0.71 mg/dL (ref 0.50–1.35)
Glucose, Bld: 89 mg/dL (ref 70–99)
Potassium: 4.1 mEq/L (ref 3.5–5.1)
Sodium: 139 mEq/L (ref 135–145)

## 2013-01-15 LAB — CBC WITH DIFFERENTIAL/PLATELET
Basophils Absolute: 0 10*3/uL (ref 0.0–0.1)
Basophils Relative: 0 % (ref 0–1)
Eosinophils Absolute: 0.1 10*3/uL (ref 0.0–0.7)
MCH: 31 pg (ref 26.0–34.0)
MCHC: 34.8 g/dL (ref 30.0–36.0)
Neutrophils Relative %: 75 % (ref 43–77)
Platelets: 205 10*3/uL (ref 150–400)
RBC: 4.64 MIL/uL (ref 4.22–5.81)

## 2013-01-15 MED ORDER — HYDROCORTISONE 1 % EX CREA: TOPICAL_CREAM | CUTANEOUS | Status: DC

## 2013-01-15 MED ORDER — HYDROMORPHONE HCL PF 1 MG/ML IJ SOLN
1.0000 mg | Freq: Once | INTRAMUSCULAR | Status: AC
  Administered 2013-01-15: 1 mg via INTRAVENOUS
  Filled 2013-01-15: qty 1

## 2013-01-15 MED ORDER — ONDANSETRON HCL 4 MG/2ML IJ SOLN
4.0000 mg | Freq: Once | INTRAMUSCULAR | Status: AC
  Administered 2013-01-15: 4 mg via INTRAVENOUS
  Filled 2013-01-15: qty 2

## 2013-01-15 MED ORDER — MORPHINE SULFATE 4 MG/ML IJ SOLN: 4.0000 mg | Freq: Once | INTRAMUSCULAR | Status: DC

## 2013-01-15 MED ORDER — SODIUM CHLORIDE 0.9 % IV BOLUS (SEPSIS)
1000.0000 mL | Freq: Once | INTRAVENOUS | Status: AC
  Administered 2013-01-15: 1000 mL via INTRAVENOUS

## 2013-01-15 MED ORDER — OXYCODONE-ACETAMINOPHEN 5-325 MG PO TABS: 1.0000 | ORAL_TABLET | Freq: Three times a day (TID) | ORAL | Status: DC | PRN

## 2013-01-15 NOTE — ED Notes (Signed)
Family at bedside. 

## 2013-01-15 NOTE — ED Notes (Signed)
Patient states he has been having a lot of gas and diarrhea since he had colostomy reversed.  He advised last night he felt like his rectum exploded.    He advised there was a moderate amount of dark blood when he had a bowel movement.

## 2013-01-15 NOTE — ED Provider Notes (Signed)
Medical screening examination/treatment/procedure(s) were performed by non-physician practitioner and as supervising physician I was immediately available for consultation/collaboration.   Dann Ventress, MD 01/15/13 1537 

## 2013-01-15 NOTE — Telephone Encounter (Signed)
Patient's mother calling stating the patient has had excruciating rectal and abdominal pain since last night. He is status post ileostomy reversal 10/22/2012. States since last night he has had 15-20 BM's - all diarrhea. He now feels like he is being "ripped open" from his rectum. He can not touch it and it is itching. He has tried soaking in a tub but this has provided no relief. He is having rectal bleeding with every bowel movement. He states the pain is worse in his rectum but he is having pain all across his abdomen. Dr Corliss Skains paged for advise. I spoke with Dr Corliss Skains who wanted to know if patient had seen Dr Dulce Sellar and what the outcome of that was. If he had not seen Dr Dulce Sellar yet to get control over diarrhea and patient has sudden onset of pain/diarrhea he should be evaluated in the ER. I called patient's mother back and she states they have not seen Dr Dulce Sellar yet - the appt is scheduled for 01/21/2013. Patient in a lot of pain. I advised her to take him the ER for evaluation. They will call back if needed.
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
## 2013-02-10 ENCOUNTER — Telehealth (INDEPENDENT_AMBULATORY_CARE_PROVIDER_SITE_OTHER): Payer: Self-pay | Admitting: General Surgery

## 2013-02-10 NOTE — Telephone Encounter (Signed)
LMOM for patient and his mother to call back and ask for Pattricia Boss, wanted to check on him to see why he did not go to the GI doctor.

## 2013-02-10 NOTE — Telephone Encounter (Signed)
Message copied by Wilder Glade on Mon Feb 10, 2013  3:38 PM ------      Message from: Grace Isaac      Created: Mon Feb 10, 2013  3:06 PM      Regarding: [facility] called to let you know this pt NO SHOWED his appointment today. Dr. Corliss Skains referred him for chronic diarrhea. He is being seen here tomorrow.      Steph ------

## 2013-02-11 ENCOUNTER — Encounter (INDEPENDENT_AMBULATORY_CARE_PROVIDER_SITE_OTHER): Payer: BC Managed Care – PPO | Admitting: Surgery

## 2013-03-10 ENCOUNTER — Encounter (INDEPENDENT_AMBULATORY_CARE_PROVIDER_SITE_OTHER): Payer: BC Managed Care – PPO | Admitting: Surgery

## 2013-03-21 ENCOUNTER — Ambulatory Visit (INDEPENDENT_AMBULATORY_CARE_PROVIDER_SITE_OTHER): Payer: BC Managed Care – PPO | Admitting: Surgery

## 2013-03-21 ENCOUNTER — Encounter (INDEPENDENT_AMBULATORY_CARE_PROVIDER_SITE_OTHER): Payer: Self-pay | Admitting: Surgery

## 2013-03-21 VITALS — BP 122/74 | HR 76 | Temp 98.0°F | Resp 18 | Ht 69.0 in | Wt 148.0 lb

## 2013-03-21 DIAGNOSIS — IMO0002 Reserved for concepts with insufficient information to code with codable children: Secondary | ICD-10-CM

## 2013-03-21 DIAGNOSIS — Y230XXS Shotgun discharge, undetermined intent, sequela: Secondary | ICD-10-CM

## 2013-03-21 DIAGNOSIS — R197 Diarrhea, unspecified: Secondary | ICD-10-CM
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
## 2013-03-21 NOTE — Patient Instructions (Signed)
Dr. Kelly Splinter - Plastic Surgery - [phone]  Dr. Hinda Lenis GI - [phone]

## 2013-03-30 ENCOUNTER — Emergency Department (HOSPITAL_COMMUNITY)
Admission: EM | Admit: 2013-03-30 | Discharge: 2013-03-31 | Disposition: A | Discharge status: Home / Self Care | Payer: BC Managed Care – PPO | Attending: Emergency Medicine | Admitting: Emergency Medicine

## 2013-03-30 DIAGNOSIS — Z8659 Personal history of other mental and behavioral disorders: Secondary | ICD-10-CM | POA: Insufficient documentation

## 2013-03-30 DIAGNOSIS — R109 Unspecified abdominal pain: Secondary | ICD-10-CM

## 2013-03-30 DIAGNOSIS — F17200 Nicotine dependence, unspecified, uncomplicated: Secondary | ICD-10-CM | POA: Insufficient documentation

## 2013-03-30 DIAGNOSIS — Z862 Personal history of diseases of the blood and blood-forming organs and certain disorders involving the immune mechanism: Secondary | ICD-10-CM | POA: Insufficient documentation

## 2013-03-30 DIAGNOSIS — Z87828 Personal history of other (healed) physical injury and trauma: Secondary | ICD-10-CM | POA: Insufficient documentation

## 2013-03-30 DIAGNOSIS — R1013 Epigastric pain: Secondary | ICD-10-CM | POA: Insufficient documentation

## 2013-03-30 NOTE — ED Notes (Signed)
C/o pain to abd/surgical site x 2-3 days.  Pt reports he last had abd surgery for hernia repair and colostomy reversal in April.

## 2013-03-31 ENCOUNTER — Emergency Department (HOSPITAL_COMMUNITY): Payer: BC Managed Care – PPO

## 2013-03-31 ENCOUNTER — Encounter (HOSPITAL_COMMUNITY): Payer: Self-pay | Admitting: Emergency Medicine

## 2013-03-31 LAB — COMPREHENSIVE METABOLIC PANEL
Alkaline Phosphatase: 74 U/L (ref 39–117)
BUN: 11 mg/dL (ref 6–23)
CO2: 25 mEq/L (ref 19–32)
Chloride: 104 mEq/L (ref 96–112)
Creatinine, Ser: 0.78 mg/dL (ref 0.50–1.35)
GFR calc Af Amer: >90 mL/min (ref >90)
GFR calc non Af Amer: >90 mL/min (ref >90)
Glucose, Bld: 79 mg/dL (ref 70–99)
Potassium: 3.7 mEq/L (ref 3.5–5.1)
Total Bilirubin: 0.5 mg/dL (ref 0.3–1.2)

## 2013-03-31 LAB — CBC WITH DIFFERENTIAL/PLATELET
Basophils Relative: 0 % (ref 0–1)
HCT: 40.5 % (ref 39.0–52.0)
Hemoglobin: 14.4 g/dL (ref 13.0–17.0)
Lymphocytes Relative: 36 % (ref 12–46)
Lymphs Abs: 3.3 10*3/uL (ref 0.7–4.0)
MCHC: 35.6 g/dL (ref 30.0–36.0)
Monocytes Absolute: 0.6 10*3/uL (ref 0.1–1.0)
Monocytes Relative: 6 % (ref 3–12)
Neutro Abs: 5.1 10*3/uL (ref 1.7–7.7)
Neutrophils Relative %: 55 % (ref 43–77)
RBC: 4.53 MIL/uL (ref 4.22–5.81)

## 2013-03-31 MED ORDER — IBUPROFEN 800 MG PO TABS: 800.0000 mg | ORAL_TABLET | Freq: Three times a day (TID) | ORAL | Status: DC

## 2013-03-31 MED ORDER — IOHEXOL 300 MG/ML  SOLN
25.0000 mL | Freq: Once | INTRAMUSCULAR | Status: AC | PRN
  Administered 2013-03-31: 25 mL via ORAL

## 2013-03-31 MED ORDER — MORPHINE SULFATE 4 MG/ML IJ SOLN
4.0000 mg | Freq: Once | INTRAMUSCULAR | Status: AC
  Administered 2013-03-31: 4 mg via INTRAVENOUS
  Filled 2013-03-31: qty 1

## 2013-03-31 MED ORDER — OXYCODONE-ACETAMINOPHEN 5-325 MG PO TABS: 1.0000 | ORAL_TABLET | ORAL | Status: DC | PRN

## 2013-03-31 MED ORDER — IOHEXOL 300 MG/ML  SOLN
100.0000 mL | Freq: Once | INTRAMUSCULAR | Status: AC | PRN
  Administered 2013-03-31: 100 mL via INTRAVENOUS
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
## 2013-04-01 NOTE — ED Provider Notes (Signed)
Medical screening examination/treatment/procedure(s) were performed by non-physician practitioner and as supervising physician I was immediately available for consultation/collaboration.   Darlys Gales, MD 04/01/13 2112

## 2013-04-05 ENCOUNTER — Emergency Department (HOSPITAL_COMMUNITY)
Admission: EM | Admit: 2013-04-05 | Discharge: 2013-04-06 | Disposition: A | Discharge status: Home / Self Care | Payer: BC Managed Care – PPO | Attending: Emergency Medicine | Admitting: Emergency Medicine

## 2013-04-05 ENCOUNTER — Encounter (HOSPITAL_COMMUNITY): Payer: Self-pay | Admitting: Adult Health

## 2013-04-05 DIAGNOSIS — K625 Hemorrhage of anus and rectum: Secondary | ICD-10-CM | POA: Insufficient documentation

## 2013-04-05 DIAGNOSIS — Z862 Personal history of diseases of the blood and blood-forming organs and certain disorders involving the immune mechanism: Secondary | ICD-10-CM | POA: Insufficient documentation

## 2013-04-05 DIAGNOSIS — R109 Unspecified abdominal pain: Secondary | ICD-10-CM

## 2013-04-05 DIAGNOSIS — F17200 Nicotine dependence, unspecified, uncomplicated: Secondary | ICD-10-CM | POA: Insufficient documentation

## 2013-04-05 DIAGNOSIS — Z8659 Personal history of other mental and behavioral disorders: Secondary | ICD-10-CM | POA: Insufficient documentation

## 2013-04-05 DIAGNOSIS — Z87828 Personal history of other (healed) physical injury and trauma: Secondary | ICD-10-CM | POA: Insufficient documentation

## 2013-04-05 DIAGNOSIS — Z79899 Other long term (current) drug therapy: Secondary | ICD-10-CM | POA: Insufficient documentation

## 2013-04-05 LAB — COMPREHENSIVE METABOLIC PANEL
ALT: 14 U/L (ref 0–53)
AST: 20 U/L (ref 0–37)
Albumin: 4.2 g/dL (ref 3.5–5.2)
CO2: 30 mEq/L (ref 19–32)
Chloride: 105 mEq/L (ref 96–112)
Creatinine, Ser: 0.86 mg/dL (ref 0.50–1.35)
GFR calc non Af Amer: >90 mL/min (ref >90)
Sodium: 142 mEq/L (ref 135–145)
Total Bilirubin: 0.5 mg/dL (ref 0.3–1.2)

## 2013-04-05 LAB — TYPE AND SCREEN: Antibody Screen: NEGATIVE

## 2013-04-05 LAB — CBC
MCV: 90.5 fL (ref 78.0–100.0)
Platelets: 192 10*3/uL (ref 150–400)
RBC: 4.33 MIL/uL (ref 4.22–5.81)
RDW: 13 % (ref 11.5–15.5)
WBC: 9.1 10*3/uL (ref 4.0–10.5)

## 2013-04-05 MED ORDER — OXYCODONE-ACETAMINOPHEN 5-325 MG PO TABS: 1.0000 | ORAL_TABLET | Freq: Four times a day (QID) | ORAL | Status: DC | PRN

## 2013-04-05 MED ORDER — SODIUM CHLORIDE 0.9 % IV BOLUS (SEPSIS)
1000.0000 mL | Freq: Once | INTRAVENOUS | Status: AC
  Administered 2013-04-05: 1000 mL via INTRAVENOUS

## 2013-04-05 MED ORDER — MORPHINE SULFATE 4 MG/ML IJ SOLN
6.0000 mg | Freq: Once | INTRAMUSCULAR | Status: AC
  Administered 2013-04-05: 6 mg via INTRAVENOUS
  Filled 2013-04-05: qty 2

## 2013-04-05 MED ORDER — HYDROCORTISONE 2.5 % RE CREA: TOPICAL_CREAM | RECTAL | Status: DC
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
## 2013-04-06 LAB — ABO/RH: ABO/RH(D): O POS

## 2013-04-06 NOTE — ED Provider Notes (Signed)
Medical screening examination/treatment/procedure(s) were performed by non-physician practitioner and as supervising physician I was immediately available for consultation/collaboration.   Ozias Dicenzo B. Margarit Minshall, MD 04/06/13 1334 

## 2013-04-06 NOTE — ED Notes (Signed)
Pt has ride home.

## 2013-04-08 ENCOUNTER — Telehealth (INDEPENDENT_AMBULATORY_CARE_PROVIDER_SITE_OTHER): Payer: Self-pay | Admitting: General Surgery

## 2013-04-08 ENCOUNTER — Other Ambulatory Visit (INDEPENDENT_AMBULATORY_CARE_PROVIDER_SITE_OTHER): Payer: Self-pay | Admitting: Surgery

## 2013-04-08 NOTE — Telephone Encounter (Signed)
I called Valdez mother this morning  and told her that Dr Corliss Skains did the letter back on 03-21-13, and I mailed the letter out the same day. She asked about the address where I mailed the letter to.  I told her that  the address that we have in our Epic system was the po box, and she stated that it was the right address, I asked her if she wants to come up to the office get the letter or I can re-mail it out again to the address in epic and she stated that she wants me to mail it out again.

## 2013-05-22 ENCOUNTER — Other Ambulatory Visit: Payer: Self-pay

## 2013-07-29 ENCOUNTER — Encounter (HOSPITAL_COMMUNITY): Payer: Self-pay | Admitting: Emergency Medicine

## 2013-07-29 ENCOUNTER — Emergency Department (HOSPITAL_COMMUNITY)
Admission: EM | Admit: 2013-07-29 | Discharge: 2013-07-29 | Disposition: A | Discharge status: Home / Self Care | Payer: BC Managed Care – PPO | Attending: Emergency Medicine | Admitting: Emergency Medicine

## 2013-07-29 DIAGNOSIS — Z8659 Personal history of other mental and behavioral disorders: Secondary | ICD-10-CM | POA: Insufficient documentation

## 2013-07-29 DIAGNOSIS — Z933 Colostomy status: Secondary | ICD-10-CM | POA: Insufficient documentation

## 2013-07-29 DIAGNOSIS — Z9089 Acquired absence of other organs: Secondary | ICD-10-CM | POA: Insufficient documentation

## 2013-07-29 DIAGNOSIS — Z86718 Personal history of other venous thrombosis and embolism: Secondary | ICD-10-CM | POA: Insufficient documentation

## 2013-07-29 DIAGNOSIS — F17200 Nicotine dependence, unspecified, uncomplicated: Secondary | ICD-10-CM | POA: Insufficient documentation

## 2013-07-29 DIAGNOSIS — R300 Dysuria: Secondary | ICD-10-CM

## 2013-07-29 DIAGNOSIS — Z87828 Personal history of other (healed) physical injury and trauma: Secondary | ICD-10-CM | POA: Insufficient documentation

## 2013-07-29 LAB — URINALYSIS, ROUTINE W REFLEX MICROSCOPIC
BILIRUBIN URINE: NEGATIVE
GLUCOSE, UA: NEGATIVE mg/dL
Hgb urine dipstick: NEGATIVE
KETONES UR: NEGATIVE mg/dL
LEUKOCYTES UA: NEGATIVE
Nitrite: NEGATIVE
Protein, ur: NEGATIVE mg/dL
Specific Gravity, Urine: 1.014 (ref 1.005–1.030)
Urobilinogen, UA: 0.2 mg/dL (ref 0.0–1.0)
pH: 6 (ref 5.0–8.0)

## 2013-07-29 LAB — GC/CHLAMYDIA PROBE AMP
CT Probe RNA: NEGATIVE
GC Probe RNA: NEGATIVE

## 2013-07-29 MED ORDER — PHENAZOPYRIDINE HCL 200 MG PO TABS: 200.0000 mg | ORAL_TABLET | Freq: Three times a day (TID) | ORAL | Status: DC | PRN
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
## 2013-07-29 NOTE — ED Provider Notes (Signed)
Medical screening examination/treatment/procedure(s) were performed by non-physician practitioner and as supervising physician I was immediately available for consultation/collaboration.  EKG Interpretation   None         Laray AngerKathleen M Shaneisha Burkel, DO 07/29/13 1538
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
## 2013-07-29 NOTE — Discharge Instructions (Signed)
Urine test did not show any signs for concerning cause of your symptoms. Please followup with a primary care provider or urologist specialist for continued evaluation and treatment.    Dysuria Dysuria is the medical term for pain with urination. There are many causes for dysuria, but urinary tract infection is the most common. If a urinalysis was performed it can show that there is a urinary tract infection. A urine culture confirms that you or your child is sick. You will need to follow up with a healthcare provider because:  If a urine culture was done you will need to know the culture results and treatment recommendations.  If the urine culture was positive, you or your child will need to be put on antibiotics or know if the antibiotics prescribed are the right antibiotics for your urinary tract infection.  If the urine culture is negative (no urinary tract infection), then other causes may need to be explored or antibiotics need to be stopped. Today laboratory work may have been done and there does not seem to be an infection. If cultures were done they will take at least 24 to 48 hours to be completed. Today x-rays may have been taken and they read as normal. No cause can be found for the problems. The x-rays may be re-read by a radiologist and you will be contacted if additional findings are made. You or your child may have been put on medications to help with this problem until you can see your primary caregiver. If the problems get better, see your primary caregiver if the problems return. If you were given antibiotics (medications which kill germs), take all of the mediations as directed for the full course of treatment.  If laboratory work was done, you need to find the results. Leave a telephone number where you can be reached. If this is not possible, make sure you find out how you are to get test results. HOME CARE INSTRUCTIONS   Drink lots of fluids. For adults, drink eight, 8 ounce  glasses of clear juice or water a day. For children, replace fluids as suggested by your caregiver.  Empty the bladder often. Avoid holding urine for long periods of time.  After a bowel movement, women should cleanse front to back, using each tissue only once.  Empty your bladder before and after sexual intercourse.  Take all the medicine given to you until it is gone. You may feel better in a few days, but TAKE ALL MEDICINE.  Avoid caffeine, tea, alcohol and carbonated beverages, because they tend to irritate the bladder.  In men, alcohol may irritate the prostate.  Only take over-the-counter or prescription medicines for pain, discomfort, or fever as directed by your caregiver.  If your caregiver has given you a follow-up appointment, it is very important to keep that appointment. Not keeping the appointment could result in a chronic or permanent injury, pain, and disability. If there is any problem keeping the appointment, you must call back to this facility for assistance. SEEK IMMEDIATE MEDICAL CARE IF:   Back pain develops.  A fever develops.  There is nausea (feeling sick to your stomach) or vomiting (throwing up).  Problems are no better with medications or are getting worse. MAKE SURE YOU:   Understand these instructions.  Will watch your condition.  Will get help right away if you are not doing well or get worse. Document Released: 03/31/2004 Document Revised: 09/25/2011 Document Reviewed: 02/06/2008 [facility] Patient Information 2014 [employer] [place].
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
## 2014-01-13 ENCOUNTER — Emergency Department (HOSPITAL_COMMUNITY)
Admission: EM | Admit: 2014-01-13 | Discharge: 2014-01-14 | Disposition: A | Discharge status: Home / Self Care | Payer: BC Managed Care – PPO | Attending: Emergency Medicine | Admitting: Emergency Medicine

## 2014-01-13 ENCOUNTER — Encounter (HOSPITAL_COMMUNITY): Payer: Self-pay | Admitting: Emergency Medicine

## 2014-01-13 ENCOUNTER — Emergency Department (HOSPITAL_COMMUNITY): Payer: BC Managed Care – PPO

## 2014-01-13 DIAGNOSIS — Z5189 Encounter for other specified aftercare: Secondary | ICD-10-CM | POA: Insufficient documentation

## 2014-01-13 DIAGNOSIS — K5289 Other specified noninfective gastroenteritis and colitis: Secondary | ICD-10-CM | POA: Insufficient documentation

## 2014-01-13 DIAGNOSIS — Z87828 Personal history of other (healed) physical injury and trauma: Secondary | ICD-10-CM | POA: Insufficient documentation

## 2014-01-13 DIAGNOSIS — K6289 Other specified diseases of anus and rectum: Secondary | ICD-10-CM | POA: Insufficient documentation

## 2014-01-13 DIAGNOSIS — F17200 Nicotine dependence, unspecified, uncomplicated: Secondary | ICD-10-CM | POA: Insufficient documentation

## 2014-01-13 DIAGNOSIS — K529 Noninfective gastroenteritis and colitis, unspecified: Secondary | ICD-10-CM

## 2014-01-13 LAB — COMPREHENSIVE METABOLIC PANEL
ALK PHOS: 65 U/L (ref 39–117)
ALT: 20 U/L (ref 0–53)
AST: 23 U/L (ref 0–37)
Albumin: 3.7 g/dL (ref 3.5–5.2)
BUN: 10 mg/dL (ref 6–23)
CO2: 24 mEq/L (ref 19–32)
Calcium: 8.6 mg/dL (ref 8.4–10.5)
Chloride: 106 mEq/L (ref 96–112)
Creatinine, Ser: 0.79 mg/dL (ref 0.50–1.35)
GFR calc non Af Amer: >90 mL/min (ref >90)
GLUCOSE: 129 mg/dL — AB (ref 70–99)
POTASSIUM: 3.4 meq/L — AB (ref 3.7–5.3)
SODIUM: 143 meq/L (ref 137–147)
Total Bilirubin: 0.9 mg/dL (ref 0.3–1.2)
Total Protein: 6.4 g/dL (ref 6.0–8.3)

## 2014-01-13 LAB — URINALYSIS, ROUTINE W REFLEX MICROSCOPIC
BILIRUBIN URINE: NEGATIVE
Glucose, UA: NEGATIVE mg/dL
HGB URINE DIPSTICK: NEGATIVE
Ketones, ur: 15 mg/dL — AB
Leukocytes, UA: NEGATIVE
Nitrite: NEGATIVE
PROTEIN: NEGATIVE mg/dL
Specific Gravity, Urine: 1.028 (ref 1.005–1.030)
UROBILINOGEN UA: 0.2 mg/dL (ref 0.0–1.0)
pH: 6 (ref 5.0–8.0)

## 2014-01-13 LAB — CBC WITH DIFFERENTIAL/PLATELET
Basophils Absolute: 0 10*3/uL (ref 0.0–0.1)
Basophils Relative: 0 % (ref 0–1)
Eosinophils Absolute: 0.2 10*3/uL (ref 0.0–0.7)
Eosinophils Relative: 2 % (ref 0–5)
HEMATOCRIT: 38.7 % — AB (ref 39.0–52.0)
HEMOGLOBIN: 13.2 g/dL (ref 13.0–17.0)
LYMPHS PCT: 22 % (ref 12–46)
Lymphs Abs: 1.7 10*3/uL (ref 0.7–4.0)
MCH: 31.7 pg (ref 26.0–34.0)
MCHC: 34.1 g/dL (ref 30.0–36.0)
MCV: 93 fL (ref 78.0–100.0)
MONOS PCT: 7 % (ref 3–12)
Monocytes Absolute: 0.5 10*3/uL (ref 0.1–1.0)
Neutro Abs: 5.2 10*3/uL (ref 1.7–7.7)
Neutrophils Relative %: 69 % (ref 43–77)
PLATELETS: 184 10*3/uL (ref 150–400)
RBC: 4.16 MIL/uL — AB (ref 4.22–5.81)
RDW: 13 % (ref 11.5–15.5)
WBC: 7.6 10*3/uL (ref 4.0–10.5)

## 2014-01-13 LAB — LIPASE, BLOOD: Lipase: 16 U/L (ref 11–59)

## 2014-01-13 LAB — POC OCCULT BLOOD, ED: Fecal Occult Bld: POSITIVE — AB

## 2014-01-13 MED ORDER — CIPROFLOXACIN HCL 500 MG PO TABS: 500.0000 mg | ORAL_TABLET | Freq: Two times a day (BID) | ORAL | Status: DC

## 2014-01-13 MED ORDER — HYDROMORPHONE HCL PF 1 MG/ML IJ SOLN
0.5000 mg | Freq: Once | INTRAMUSCULAR | Status: AC
  Administered 2014-01-13: 0.5 mg via INTRAVENOUS
  Filled 2014-01-13: qty 1

## 2014-01-13 MED ORDER — MORPHINE SULFATE 4 MG/ML IJ SOLN
6.0000 mg | Freq: Once | INTRAMUSCULAR | Status: AC
  Administered 2014-01-13: 6 mg via INTRAVENOUS
  Filled 2014-01-13: qty 2

## 2014-01-13 MED ORDER — SODIUM CHLORIDE 0.9 % IV BOLUS (SEPSIS)
1000.0000 mL | Freq: Once | INTRAVENOUS | Status: AC
  Administered 2014-01-13: 1000 mL via INTRAVENOUS

## 2014-01-13 MED ORDER — OXYCODONE-ACETAMINOPHEN 5-325 MG PO TABS: 2.0000 | ORAL_TABLET | Freq: Once | ORAL | Status: DC

## 2014-01-13 MED ORDER — METRONIDAZOLE 500 MG PO TABS: 500.0000 mg | ORAL_TABLET | Freq: Two times a day (BID) | ORAL | Status: DC

## 2014-01-13 MED ORDER — OXYCODONE-ACETAMINOPHEN 5-325 MG PO TABS
1.0000 | ORAL_TABLET | ORAL | Status: DC | PRN
Start: 2014-01-13 — End: 2014-02-17

## 2014-01-13 MED ORDER — ONDANSETRON HCL 4 MG/2ML IJ SOLN
4.0000 mg | Freq: Once | INTRAMUSCULAR | Status: AC
  Administered 2014-01-13: 4 mg via INTRAVENOUS
  Filled 2014-01-13: qty 2

## 2014-01-13 MED ORDER — IOHEXOL 300 MG/ML  SOLN
100.0000 mL | Freq: Once | INTRAMUSCULAR | Status: AC | PRN
  Administered 2014-01-13: 100 mL via INTRAVENOUS

## 2014-01-13 MED ORDER — HYDROCORTISONE ACETATE 25 MG RE SUPP: 25.0000 mg | Freq: Two times a day (BID) | RECTAL | Status: DC
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
## 2014-01-13 NOTE — ED Notes (Signed)
Introduced self to patient.  Describes his diarrhea as usual but has gone approx 15 times today which disabled him from going to work.  Reports anus area raw and sore.  States the sore area is there most days but worse today due to the increased diarrhea; Does not use any type cream on area. Discussed possible use of diaper rash type ointments, desitin, a&d ointment, etc.  States he once had a prescription cream but it didn't really help either.

## 2014-01-13 NOTE — Discharge Instructions (Signed)
°Emergency Department Resource Guide °1) Find a Doctor and Pay Out of Pocket °Although you won't have to find out who is covered by your insurance plan, it is a good idea to ask around and get recommendations. You will then need to call the office and see if the doctor you have chosen will accept you as a new patient and what types of options they offer for patients who are self-pay. Some doctors offer discounts or will set up payment plans for their patients who do not have insurance, but you will need to ask so you aren't surprised when you get to your appointment. ° °2) Contact Your Local Health Department °Not all health departments have doctors that can see patients for sick visits, but many do, so it is worth a call to see if yours does. If you don't know where your local health department is, you can check in your phone book. The CDC also has a tool to help you locate your state's health department, and many state websites also have listings of all of their local health departments. ° °3) Find a Walk-in Clinic °If your illness is not likely to be very severe or complicated, you may want to try a walk in clinic. These are popping up all over the country in pharmacies, drugstores, and shopping centers. They're usually staffed by nurse practitioners or physician assistants that have been trained to treat common illnesses and complaints. They're usually fairly quick and inexpensive. However, if you have serious medical issues or chronic medical problems, these are probably not your best option. ° °No Primary Care Doctor: °- Call [facility] at  832-8000 - they can help you locate a primary care doctor that  accepts your insurance, provides certain services, etc. °- Physician Referral Service- 1-800-533-3463 ° °Chronic Pain Problems: °Organization         Address  Phone   Notes  °[facility]  (336) 297-2271 Patients need to be referred by their primary care doctor.  ° °Medication  Assistance: °Organization         Address  Phone   Notes  °[place] [facility] [address] °[place], [place] [address] (336) 641-8030 --Must be a resident of [place] °-- Must have NO insurance coverage whatsoever (no Medicaid/ Medicare, etc.) °-- The pt. MUST have a primary care doctor that directs their care regularly and follows them in the community °  °[facility]  (866) 331-1348   °[employer]  (888) 892-1162   ° °Agencies that provide inexpensive medical care: °Organization         Address  Phone   Notes  °[facility]  (336) 832-8035   °[facility]    (336) 832-7272   °[facility] [address] °[place], [place] [address] (336) 832-4777   °[facility] [address], °[place] (336) 271-4999   °[facility]    (336) 373-0678   °[facility]    (336) 272-1050   °[facility] ° [address], [place] Phone:  (336) 832-4444, Fax:  (336) 832-4440 Hours of Operation:  9 am - 6 pm, M-F.  Also accepts Medicaid/Medicare and self-pay.  °[facility] ° [address], [place] Phone: (336) 832-3150, Fax: (336) 832-3151. Hours of Operation:  8:30 am - 5:30 pm, M-F.  Also accepts Medicaid and self-pay.  °[facility] [place] [address], [place] Phone: (336) 878-6027   °[facility] [address], [place], [place] (336)723-1848, Ext. 123 Mondays & Thursdays: 7-9 AM.  First 15 patients are seen on a first come, first serve basis. °  ° °Medicaid-accepting [place] Providers: ° °Organization         Address  Phone   Notes  °[facility] [address], [place] (336) 641-2100 Also accepts self-pay patients.  °[facility] [address], [place] ° (336) 856-9996   °[facility] [address], [place]  (336) 288-8857   °[facility] [address] [place] [address], [place] (336) 299-7000   °Veita Bland [address], [place]  ° (336) 373-1557 Only accepts [place] Access Medicaid patients after they have their name applied to their card.  ° °Self-Pay (no insurance) in [place]: ° °Organization         Address  Phone   Notes  °Sickle Cell Patients, [facility] [address], [place] (336) 832-1970   °[facility] [address], [place] (336) 832-4400   ° [facility] ° [address], [place] (336) 992-4800   °[facility]/Dr. Osei-Bonsu ° [address] [place] [address], [place] or [address], [place] (336) 841-8500 Phone number for both [place] and [place] locations is the same.  °[facility] [address], [place] (336) 299-0000   °[facility] [address] [place] [address], [place] or [address] (336) 852-7530 °(336) 878-2260   °[facility] [address], [place] (336) 350-1642, phone; (336) 294-5005, fax Sees patients 1st and 3rd Saturday of every month.  Must not qualify for public or private insurance (i.e. Medicaid, Medicare, [place] Health Choice, Veterans' Benefits) • Household income should be no more than 200% of the poverty level •The clinic cannot treat you if you are pregnant or think you are pregnant • Sexually transmitted diseases are not treated at the clinic.  ° ° °Dental Care: °Organization         Address  Phone  Notes  °[place] [facility] [address],  (336) 641-6152 Accepts children up to age 21 who are enrolled in Medicaid or [place] Health Choice; pregnant women with a Medicaid card; and children who have applied for Medicaid or [place] Health Choice, but were declined, whose parents can pay a reduced fee at time of service.  °[place]  [facility] [place]  [address], [place] (336) 641-7733 Accepts children up to age 21 who are enrolled in Medicaid or [place] Health Choice; pregnant women with a Medicaid card; and children who have applied for Medicaid or [place] Health Choice, but were declined, whose parents can pay a reduced fee at time of service.  °[facility] ° [address],  (336) 641-4533 Patients are seen by appointment only. Walk-ins are not accepted. [facility] will see patients 18 years of age and older. °Monday - Tuesday (8am-5pm) °Most Wednesdays (8:30-5pm) °$30 per visit, cash only  °[facility] ° [address], [place] (336) 641-4533 Patients are seen by appointment only. Walk-ins are not accepted. [facility] will see patients 18 years of age and older. °One   Wednesday Evening (Monthly: Volunteer Based).  $30 per visit, cash only  °[facility]  (919) 537-3737 for adults; Children under age 4, call Graduate Pediatric Dentistry at (919) [phone]. Children aged 4-14, please call (919) [phone] to request a pediatric application. ° Dental services are provided in all areas of dental care including fillings, crowns and bridges, complete and partial dentures, implants, gum treatment, root canals, and extractions. Preventive care is also provided. Treatment is provided to both adults and children. °Patients are selected via a lottery and there is often a waiting list. °  °[facility] [address], °[place] ° (336) 763-8833 [email or web address] °  °[facility] [address], [place], [place] (336)723-1848, Ext. 123 Second and Fourth Thursday of each month, opens at 6:30 AM; Clinic ends at 9 AM.  Patients are seen on a first-come first-served basis, and a limited number are seen during each clinic.  ° °[facility] ° [address], [place], [place] (336) 723-7904    Eligibility Requirements °You must have lived in [place], [place], or [place] counties for at least the last three months. °  You cannot be eligible for state or federal sponsored healthcare insurance, including Veterans Administration, Medicaid, or Medicare. °  You generally cannot be eligible for healthcare insurance through your employer.  °  How to apply: °Eligibility screenings are held every Tuesday and Wednesday afternoon from 1:00 pm until 4:00 pm. You do not need an appointment for the interview!  °[facility] [address], [place], [place] 336-631-2330   °[facility]  336-342-8273   °[facility]  336-703-3100   °[facility]  336-570-6415   ° °Behavioral Health Resources in the Community: °Intensive Outpatient Programs °Organization         Address  Phone  Notes  °[facility] [address], [place], [place] 336-878-6098   °[facility] [address], [place], [place] 336-832-9800   °[facility] [address], [place], [place] ° 336-882-2125   °[facility] [address],  °[place], [place] 1-800-853-5163 or 336-641-4981   °Substance Abuse Resources °Organization         Address  Phone  Notes  °[facility]  336-882-2125   °[facility]  336-784-9470   °[facility]  336-285-9073   °[facility]  336-845-3988   °Residential & Outpatient Substance Abuse Program  1-800-659-3381   °Psychological Services °Organization         Address  Phone  Notes  °[facility]  336- 832-9600   °[facility]  336- 378-7881   °[facility] [address], [place] 1-800-853-5163 or 336-641-4981   ° °Mobile Crisis Teams °Organization         Address  Phone  Notes  °[facility], Mobile Crisis Care Unit  1-877-626-1772   °Assertive °[facility] ° [address]  [place], [place] 336-834-9664   °Sharon DeEsch [address] °[place] 336-554-5454   ° °Self-Help/Support Groups °Organization         Address  Phone             Notes  °[facility] [place] - variety of support groups  336- 373-1402 Call for more information  °Narcotics Anonymous (NA), [facility] [address], °[place]  2 meetings at this location  ° °  Residential Treatment Programs °Organization         Address  Phone  Notes  °[facility] [address],    °[place]  1-866-801-8205   °[facility] ° [address], [place], [place] 704-293-8524   °[facility] [address], [place] 336-845-3988 Admissions: 8am-3pm M-F  °[facility] [address]    °[place], [place] 336-841-1104   °[facility] [address], [place], [place] 336-379-7146   °[facility] [address]  °[place], [place] 336-285-9073   °[facility] - Intensive Outpatient [address], [place], [place] 336-852-3033   °[facility] [address]  °[place], [place] 1-877-615-2722 or 336-784-9470   °[facility] [address] [place], [place] 336-227-7417 Accepts Medicaid  °[facility] [address]  °[place] 1-800-659-3381 Substance Abuse/Addiction Treatment  ° °[place] Behavioral Health Resources °Organization         Address  Phone  Notes  °[facility]  (888) 581-9988   °Julie Brannon, PhD [address] [place], [place]   (336) 349-5553 or (336) 951-0000   °[facility]   [address] °[place], [place] (336) 349-4454   °[facility] [address], [place], [place] (336) 342-8316 Insurance/Medicaid/sponsorship through [facility]  °[facility] [address]                                    [place], [place] (336) 342-8316 Therapy/tele-psych/case    °[facility] [address]  ° [place],  (336) 349-2233    °Dr. Arfeen  (336) 349-4544   °[facility] [place]  [employer] [place] [facility] 1) [address], [place] °2) [address], [place] °3)  [address], [place] (336) 349-3220 °(336) 342-7768 ° °(336) 342-8140   °[place] [facility] (336) 342-1394 or (336) 342-3537 (After Hours)    ° ° °

## 2014-01-16 NOTE — ED Provider Notes (Signed)
Medical screening examination/treatment/procedure(s) were conducted as a shared visit with resident physician and myself.  I personally evaluated the patient during the encounter.   I interviewed and examined the patient. Lungs are CTAB. Cardiac exam wnl. Abdomen soft w/out focal ttp. Abd wall hernia in RLQ/right inguinal area. I interpreted/reviewed the labs and/or imaging. Ct c/w colitis. Will tx as outpt.   Junius ArgyleForrest S Truc Winfree, MD 01/16/14 1105

## 2014-02-16 ENCOUNTER — Encounter (HOSPITAL_COMMUNITY): Payer: Self-pay | Admitting: Emergency Medicine

## 2014-02-16 ENCOUNTER — Emergency Department (HOSPITAL_COMMUNITY)
Admission: EM | Admit: 2014-02-16 | Discharge: 2014-02-17 | Disposition: A | Discharge status: Home / Self Care | Payer: BC Managed Care – PPO | Attending: Emergency Medicine | Admitting: Emergency Medicine

## 2014-02-16 DIAGNOSIS — Z792 Long term (current) use of antibiotics: Secondary | ICD-10-CM | POA: Insufficient documentation

## 2014-02-16 DIAGNOSIS — F17200 Nicotine dependence, unspecified, uncomplicated: Secondary | ICD-10-CM | POA: Insufficient documentation

## 2014-02-16 DIAGNOSIS — Z8659 Personal history of other mental and behavioral disorders: Secondary | ICD-10-CM | POA: Insufficient documentation

## 2014-02-16 DIAGNOSIS — Z9889 Other specified postprocedural states: Secondary | ICD-10-CM | POA: Insufficient documentation

## 2014-02-16 DIAGNOSIS — Z862 Personal history of diseases of the blood and blood-forming organs and certain disorders involving the immune mechanism: Secondary | ICD-10-CM | POA: Insufficient documentation

## 2014-02-16 DIAGNOSIS — K625 Hemorrhage of anus and rectum: Secondary | ICD-10-CM | POA: Insufficient documentation

## 2014-02-16 DIAGNOSIS — Z87828 Personal history of other (healed) physical injury and trauma: Secondary | ICD-10-CM | POA: Insufficient documentation

## 2014-02-16 DIAGNOSIS — Z9049 Acquired absence of other specified parts of digestive tract: Secondary | ICD-10-CM | POA: Insufficient documentation

## 2014-02-16 DIAGNOSIS — Z933 Colostomy status: Secondary | ICD-10-CM | POA: Insufficient documentation

## 2014-02-16 DIAGNOSIS — Z8739 Personal history of other diseases of the musculoskeletal system and connective tissue: Secondary | ICD-10-CM | POA: Insufficient documentation

## 2014-02-16 DIAGNOSIS — R109 Unspecified abdominal pain: Secondary | ICD-10-CM | POA: Insufficient documentation

## 2014-02-16 NOTE — ED Notes (Signed)
Pt states he was seen here about a month ago and was diagnosed with an infection in his intestines  Pt states he is having abd pain and had some diarrhea yesterday  Today he states he is having some rectal bleeding

## 2014-02-17 LAB — I-STAT CHEM 8, ED
BUN: 7 mg/dL (ref 6–23)
CALCIUM ION: 1.19 mmol/L (ref 1.12–1.23)
CREATININE: 0.8 mg/dL (ref 0.50–1.35)
Chloride: 101 mEq/L (ref 96–112)
Glucose, Bld: 64 mg/dL — ABNORMAL LOW (ref 70–99)
HCT: 41 % (ref 39.0–52.0)
Hemoglobin: 13.9 g/dL (ref 13.0–17.0)
Potassium: 3.2 mEq/L — ABNORMAL LOW (ref 3.7–5.3)
Sodium: 140 mEq/L (ref 137–147)
TCO2: 25 mmol/L (ref 0–100)

## 2014-02-17 MED ORDER — MORPHINE SULFATE 4 MG/ML IJ SOLN
4.0000 mg | Freq: Once | INTRAMUSCULAR | Status: AC
  Administered 2014-02-17: 4 mg via INTRAVENOUS
  Filled 2014-02-17: qty 1

## 2014-02-17 MED ORDER — METRONIDAZOLE 500 MG PO TABS
500.0000 mg | ORAL_TABLET | Freq: Once | ORAL | Status: AC
  Administered 2014-02-17: 500 mg via ORAL
  Filled 2014-02-17: qty 1

## 2014-02-17 MED ORDER — HYDROCODONE-ACETAMINOPHEN 5-325 MG PO TABS
1.0000 | ORAL_TABLET | Freq: Four times a day (QID) | ORAL | Status: DC | PRN
Start: 2014-02-17 — End: 2015-03-09

## 2014-02-17 MED ORDER — KETOROLAC TROMETHAMINE 30 MG/ML IJ SOLN
30.0000 mg | Freq: Once | INTRAMUSCULAR | Status: AC
  Administered 2014-02-17: 30 mg via INTRAVENOUS
  Filled 2014-02-17: qty 1

## 2014-02-17 MED ORDER — METRONIDAZOLE 500 MG PO TABS: 500.0000 mg | ORAL_TABLET | Freq: Two times a day (BID) | ORAL | Status: DC

## 2014-02-17 NOTE — ED Notes (Signed)
Patient reports a 2 year hx of abd problems. Patient states he was seen about 1 month ago and was diagnosed with colitis. Patient states he was at work today when he began having bloody stools. Patient states he has had multiple stools that were of diarrhea consistency, last episode at 1800. Patient denies blood thinners and NSAID usage. Patient c/o generalized abd pain, states he has a chronic achy abd pain but this is different.
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
## 2014-05-21 ENCOUNTER — Emergency Department (HOSPITAL_COMMUNITY)
Admission: EM | Admit: 2014-05-21 | Discharge: 2014-05-21 | Disposition: A | Discharge status: Home / Self Care | Payer: BC Managed Care – PPO | Attending: Emergency Medicine | Admitting: Emergency Medicine

## 2014-05-21 DIAGNOSIS — K439 Ventral hernia without obstruction or gangrene: Secondary | ICD-10-CM | POA: Diagnosis not present

## 2014-05-21 DIAGNOSIS — Z8739 Personal history of other diseases of the musculoskeletal system and connective tissue: Secondary | ICD-10-CM | POA: Diagnosis not present

## 2014-05-21 DIAGNOSIS — Z792 Long term (current) use of antibiotics: Secondary | ICD-10-CM | POA: Diagnosis not present

## 2014-05-21 DIAGNOSIS — Z8659 Personal history of other mental and behavioral disorders: Secondary | ICD-10-CM | POA: Diagnosis not present

## 2014-05-21 DIAGNOSIS — Z87828 Personal history of other (healed) physical injury and trauma: Secondary | ICD-10-CM | POA: Diagnosis not present

## 2014-05-21 DIAGNOSIS — Z720 Tobacco use: Secondary | ICD-10-CM | POA: Insufficient documentation

## 2014-05-21 DIAGNOSIS — Z862 Personal history of diseases of the blood and blood-forming organs and certain disorders involving the immune mechanism: Secondary | ICD-10-CM | POA: Insufficient documentation

## 2014-05-21 DIAGNOSIS — K625 Hemorrhage of anus and rectum: Secondary | ICD-10-CM | POA: Diagnosis not present

## 2014-05-21 LAB — COMPREHENSIVE METABOLIC PANEL
ALT: 17 U/L (ref 0–53)
AST: 22 U/L (ref 0–37)
Albumin: 3.7 g/dL (ref 3.5–5.2)
Alkaline Phosphatase: 75 U/L (ref 39–117)
Anion gap: 10 (ref 5–15)
BUN: 13 mg/dL (ref 6–23)
CALCIUM: 8.9 mg/dL (ref 8.4–10.5)
CO2: 28 mEq/L (ref 19–32)
CREATININE: 0.84 mg/dL (ref 0.50–1.35)
Chloride: 100 mEq/L (ref 96–112)
GFR calc non Af Amer: >90 mL/min (ref >90)
GLUCOSE: 97 mg/dL (ref 70–99)
Potassium: 3.8 mEq/L (ref 3.7–5.3)
SODIUM: 138 meq/L (ref 137–147)
TOTAL PROTEIN: 6.9 g/dL (ref 6.0–8.3)
Total Bilirubin: 0.4 mg/dL (ref 0.3–1.2)

## 2014-05-21 LAB — CBC WITH DIFFERENTIAL/PLATELET
Basophils Absolute: 0 10*3/uL (ref 0.0–0.1)
Basophils Relative: 0 % (ref 0–1)
EOS ABS: 0.1 10*3/uL (ref 0.0–0.7)
EOS PCT: 1 % (ref 0–5)
HCT: 41.2 % (ref 39.0–52.0)
Hemoglobin: 14 g/dL (ref 13.0–17.0)
Lymphocytes Relative: 20 % (ref 12–46)
Lymphs Abs: 1.5 10*3/uL (ref 0.7–4.0)
MCH: 31.1 pg (ref 26.0–34.0)
MCHC: 34 g/dL (ref 30.0–36.0)
MCV: 91.6 fL (ref 78.0–100.0)
Monocytes Absolute: 0.6 10*3/uL (ref 0.1–1.0)
Monocytes Relative: 8 % (ref 3–12)
Neutro Abs: 5.2 10*3/uL (ref 1.7–7.7)
Neutrophils Relative %: 71 % (ref 43–77)
PLATELETS: 204 10*3/uL (ref 150–400)
RBC: 4.5 MIL/uL (ref 4.22–5.81)
RDW: 12.7 % (ref 11.5–15.5)
WBC: 7.3 10*3/uL (ref 4.0–10.5)

## 2014-05-21 LAB — POC OCCULT BLOOD, ED: FECAL OCCULT BLD: POSITIVE — AB

## 2014-05-21 NOTE — Discharge Instructions (Signed)
It was our pleasure to provide your ER care today - we hope that you feel better.  Drink plenty of fluids. Follow up with your surgeons in coming week - discuss further management of your abdominal wall hernia then - see referral - call office to arrange appointment. For rectal bleeding and loose stools, follow up with gi doctor in the coming week - see referral - call office to arrange appointment.   Return to ER if worse, new symptoms, persistent vomiting, fevers, weak/faint, severe abdominal pain, other concern.      Rectal Bleeding Rectal bleeding is when blood passes out of the anus. It is usually a sign that something is wrong. It may not be serious, but it should always be evaluated. Rectal bleeding may present as bright red blood or extremely dark stools. The color may range from dark red or maroon to black (like tar). It is important that the cause of rectal bleeding be identified so treatment can be started and the problem corrected. CAUSES   Hemorrhoids. These are enlarged (dilated) blood vessels or veins in the anal or rectal area.  Fistulas. Theseare abnormal, burrowing channels that usually run from inside the rectum to the skin around the anus. They can bleed.  Anal fissures. This is a tear in the tissue of the anus. Bleeding occurs with bowel movements.  Diverticulosis. This is a condition in which pockets or sacs project from the bowel wall. Occasionally, the sacs can bleed.  Diverticulitis. Thisis an infection involving diverticulosis of the colon.  Proctitis and colitis. These are conditions in which the rectum, colon, or both, can become inflamed and pitted (ulcerated).  Polyps and cancer. Polyps are non-cancerous (benign) growths in the colon that may bleed. Certain types of polyps turn into cancer.  Protrusion of the rectum. Part of the rectum can project from the anus and bleed.  Certain medicines.  Intestinal infections.  Blood vessel abnormalities. HOME  CARE INSTRUCTIONS  Eat a high-fiber diet to keep your stool soft.  Limit activity.  Drink enough fluids to keep your urine clear or pale yellow.  Warm baths may be useful to soothe rectal pain.  Follow up with your caregiver as directed. SEEK IMMEDIATE MEDICAL CARE IF:  You develop increased bleeding.  You have black or dark red stools.  You vomit blood or material that looks like coffee grounds.  You have abdominal pain or tenderness.  You have a fever.  You feel weak, nauseous, or you faint.  You have severe rectal pain or you are unable to have a bowel movement. MAKE SURE YOU:  Understand these instructions.  Will watch your condition.  Will get help right away if you are not doing well or get worse. Document Released: 12/23/2001 Document Revised: 09/25/2011 Document Reviewed: 12/18/2010 [facility] Patient Information 2015 [place] [employer]Care, [place]. This information is not intended to replace advice given to you by your health care provider. Make sure you discuss any questions you have with your health care provider.     Hernia A hernia occurs when an internal organ pushes out through a weak spot in the abdominal wall. Hernias most commonly occur in the groin and around the navel. Hernias often can be pushed back into place (reduced). Most hernias tend to get worse over time. Some abdominal hernias can get stuck in the opening (irreducible or incarcerated hernia) and cannot be reduced. An irreducible abdominal hernia which is tightly squeezed into the opening is at risk for impaired blood supply (strangulated hernia). A  strangulated hernia is a medical emergency. Because of the risk for an irreducible or strangulated hernia, surgery may be recommended to repair a hernia. CAUSES   Heavy lifting.  Prolonged coughing.  Straining to have a bowel movement.  A cut (incision) made during an abdominal surgery. HOME CARE INSTRUCTIONS   Bed rest is not required. You may continue your  normal activities.  Avoid lifting more than 10 pounds (4.5 kg) or straining.  Cough gently. If you are a smoker it is best to stop. Even the best hernia repair can break down with the continual strain of coughing. Even if you do not have your hernia repaired, a cough will continue to aggravate the problem.  Do not wear anything tight over your hernia. Do not try to keep it in with an outside bandage or truss. These can damage abdominal contents if they are trapped within the hernia sac.  Eat a normal diet.  Avoid constipation. Straining over long periods of time will increase hernia size and encourage breakdown of repairs. If you cannot do this with diet alone, stool softeners may be used. SEEK IMMEDIATE MEDICAL CARE IF:   You have a fever.  You develop increasing abdominal pain.  You feel nauseous or vomit.  Your hernia is stuck outside the abdomen, looks discolored, feels hard, or is tender.  You have any changes in your bowel habits or in the hernia that are unusual for you.  You have increased pain or swelling around the hernia.  You cannot push the hernia back in place by applying gentle pressure while lying down. MAKE SURE YOU:   Understand these instructions.  Will watch your condition.  Will get help right away if you are not doing well or get worse. Document Released: 07/03/2005 Document Revised: 09/25/2011 Document Reviewed: 02/20/2008 [facility] Patient Information 2015 [place]ExitCare, [place]LLC. This information is not intended to replace advice given to you by your health care provider. Make sure you discuss any questions you have with your health care provider.
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
## 2014-05-21 NOTE — ED Notes (Signed)
Per pt, states has been having blood in stool on and off for 2 years-had colostomy reversed in April of last year

## 2014-06-26 ENCOUNTER — Other Ambulatory Visit: Payer: Self-pay | Admitting: Gastroenterology

## 2014-12-01 ENCOUNTER — Emergency Department (HOSPITAL_COMMUNITY)
Admission: EM | Admit: 2014-12-01 | Discharge: 2014-12-01 | Disposition: A | Discharge status: Home / Self Care | Payer: Self-pay | Attending: Emergency Medicine | Admitting: Emergency Medicine

## 2014-12-01 ENCOUNTER — Encounter (HOSPITAL_COMMUNITY): Payer: Self-pay | Admitting: Emergency Medicine

## 2014-12-01 ENCOUNTER — Emergency Department (HOSPITAL_COMMUNITY): Payer: Self-pay

## 2014-12-01 DIAGNOSIS — Z8659 Personal history of other mental and behavioral disorders: Secondary | ICD-10-CM | POA: Insufficient documentation

## 2014-12-01 DIAGNOSIS — Z720 Tobacco use: Secondary | ICD-10-CM | POA: Insufficient documentation

## 2014-12-01 DIAGNOSIS — R1030 Lower abdominal pain, unspecified: Secondary | ICD-10-CM | POA: Insufficient documentation

## 2014-12-01 DIAGNOSIS — Z9889 Other specified postprocedural states: Secondary | ICD-10-CM | POA: Insufficient documentation

## 2014-12-01 DIAGNOSIS — Z862 Personal history of diseases of the blood and blood-forming organs and certain disorders involving the immune mechanism: Secondary | ICD-10-CM | POA: Insufficient documentation

## 2014-12-01 LAB — COMPREHENSIVE METABOLIC PANEL
ALT: 20 U/L (ref 17–63)
AST: 23 U/L (ref 15–41)
Albumin: 4.1 g/dL (ref 3.5–5.0)
Alkaline Phosphatase: 69 U/L (ref 38–126)
Anion gap: 9 (ref 5–15)
BUN: 12 mg/dL (ref 6–20)
CO2: 26 mmol/L (ref 22–32)
CREATININE: 0.77 mg/dL (ref 0.61–1.24)
Calcium: 8.7 mg/dL — ABNORMAL LOW (ref 8.9–10.3)
Chloride: 106 mmol/L (ref 101–111)
GFR calc Af Amer: >60 mL/min (ref >60)
Glucose, Bld: 82 mg/dL (ref 65–99)
POTASSIUM: 3.4 mmol/L — AB (ref 3.5–5.1)
SODIUM: 141 mmol/L (ref 135–145)
Total Bilirubin: 0.9 mg/dL (ref 0.3–1.2)
Total Protein: 6.5 g/dL (ref 6.5–8.1)

## 2014-12-01 LAB — CBC WITH DIFFERENTIAL/PLATELET
Basophils Absolute: 0 10*3/uL (ref 0.0–0.1)
Basophils Relative: 0 % (ref 0–1)
EOS ABS: 0.1 10*3/uL (ref 0.0–0.7)
Eosinophils Relative: 1 % (ref 0–5)
HCT: 41.4 % (ref 39.0–52.0)
Hemoglobin: 13.9 g/dL (ref 13.0–17.0)
Lymphocytes Relative: 25 % (ref 12–46)
Lymphs Abs: 2.3 10*3/uL (ref 0.7–4.0)
MCH: 30.5 pg (ref 26.0–34.0)
MCHC: 33.6 g/dL (ref 30.0–36.0)
MCV: 91 fL (ref 78.0–100.0)
MONO ABS: 0.6 10*3/uL (ref 0.1–1.0)
Monocytes Relative: 7 % (ref 3–12)
Neutro Abs: 5.9 10*3/uL (ref 1.7–7.7)
Neutrophils Relative %: 67 % (ref 43–77)
PLATELETS: 201 10*3/uL (ref 150–400)
RBC: 4.55 MIL/uL (ref 4.22–5.81)
RDW: 13.1 % (ref 11.5–15.5)
WBC: 9 10*3/uL (ref 4.0–10.5)

## 2014-12-01 LAB — URINALYSIS, ROUTINE W REFLEX MICROSCOPIC
BILIRUBIN URINE: NEGATIVE
GLUCOSE, UA: NEGATIVE mg/dL
HGB URINE DIPSTICK: NEGATIVE
KETONES UR: NEGATIVE mg/dL
Leukocytes, UA: NEGATIVE
NITRITE: NEGATIVE
Protein, ur: NEGATIVE mg/dL
Specific Gravity, Urine: 1.02 (ref 1.005–1.030)
Urobilinogen, UA: 0.2 mg/dL (ref 0.0–1.0)
pH: 6 (ref 5.0–8.0)

## 2014-12-01 LAB — I-STAT CHEM 8, ED
BUN: 11 mg/dL (ref 6–20)
Calcium, Ion: 1.14 mmol/L (ref 1.12–1.23)
Chloride: 102 mmol/L (ref 101–111)
Creatinine, Ser: 0.7 mg/dL (ref 0.61–1.24)
Glucose, Bld: 78 mg/dL (ref 65–99)
HCT: 42 % (ref 39.0–52.0)
Hemoglobin: 14.3 g/dL (ref 13.0–17.0)
Potassium: 3.4 mmol/L — ABNORMAL LOW (ref 3.5–5.1)
SODIUM: 142 mmol/L (ref 135–145)
TCO2: 24 mmol/L (ref 0–100)

## 2014-12-01 MED ORDER — IOHEXOL 300 MG/ML  SOLN
100.0000 mL | Freq: Once | INTRAMUSCULAR | Status: AC | PRN
  Administered 2014-12-01: 80 mL via INTRAVENOUS

## 2014-12-01 MED ORDER — IOHEXOL 300 MG/ML  SOLN
50.0000 mL | Freq: Once | INTRAMUSCULAR | Status: AC | PRN
  Administered 2014-12-01: 50 mL via ORAL

## 2014-12-01 MED ORDER — HYDROMORPHONE HCL 1 MG/ML IJ SOLN
1.0000 mg | Freq: Once | INTRAMUSCULAR | Status: AC
  Administered 2014-12-01: 1 mg via INTRAVENOUS
  Filled 2014-12-01: qty 1

## 2014-12-01 NOTE — Progress Notes (Signed)
EDCM spoke to patient at bedside.  Patient confirms he has [employer] insurance without a pcp.  [facility] with list of pcps who accept BCBS insurance within a ten mile radius of patient's zip code [phone].  Discussed use of emergency room for emergency situations and use of urgent care centers.  Patient verbalized understanding.  No further EDCM needs at this time.

## 2014-12-01 NOTE — Discharge Instructions (Signed)
Abdominal Pain Many things can cause abdominal pain. Usually, abdominal pain is not caused by a disease and will improve without treatment. It can often be observed and treated at home. Your health care provider will do a physical exam and possibly order blood tests and X-rays to help determine the seriousness of your pain. However, in many cases, more time must pass before a clear cause of the pain can be found. Before that point, your health care provider may not know if you need more testing or further treatment. HOME CARE INSTRUCTIONS  Monitor your abdominal pain for any changes. The following actions may help to alleviate any discomfort you are experiencing:  Only take over-the-counter or prescription medicines as directed by your health care provider.  Do not take laxatives unless directed to do so by your health care provider.  Try a clear liquid diet (broth, tea, or water) as directed by your health care provider. Slowly move to a bland diet as tolerated. SEEK MEDICAL CARE IF:  You have unexplained abdominal pain.  You have abdominal pain associated with nausea or diarrhea.  You have pain when you urinate or have a bowel movement.  You experience abdominal pain that wakes you in the night.  You have abdominal pain that is worsened or improved by eating food.  You have abdominal pain that is worsened with eating fatty foods.  You have a fever. SEEK IMMEDIATE MEDICAL CARE IF:   Your pain does not go away within 2 hours.  You keep throwing up (vomiting).  Your pain is felt only in portions of the abdomen, such as the right side or the left lower portion of the abdomen.  You pass bloody or black tarry stools. MAKE SURE YOU:  Understand these instructions.   Will watch your condition.   Will get help right away if you are not doing well or get worse.  Document Released: 04/12/2005 Document Revised: 07/08/2013 Document Reviewed: 03/12/2013 [facility] Patient Information  2015 [place]Exit[employer], [place]LL[place]. This information is not intended to replace advice given to you by your health care provider. Make sure you discuss any questions you have with your health care provider.  Please monitor for worsening signs or symptoms. [facility] if symptoms continue to persist, or the emergency room if they worsen.
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
## 2014-12-01 NOTE — ED Notes (Signed)
Patient transported to CT 
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
## 2014-12-08 NOTE — ED Notes (Signed)
Patient returns today (12/08/14 @2233 ) requesting a work note for visit on 12/01/2014. Work note given, return to work date 12/04/2014. Explained to patient per the notes from his visit this is the greatest length of time from work we can write him a note for. Patient encouraged to follow up with as directed per discharge instructions for additional follow up, and additional work notes as needed related to this incident.

## 2015-03-09 ENCOUNTER — Emergency Department (HOSPITAL_COMMUNITY)
Admission: EM | Admit: 2015-03-09 | Discharge: 2015-03-09 | Disposition: A | Discharge status: Home / Self Care | Payer: BLUE CROSS/BLUE SHIELD | Attending: Emergency Medicine | Admitting: Emergency Medicine

## 2015-03-09 ENCOUNTER — Encounter (HOSPITAL_COMMUNITY): Payer: Self-pay | Admitting: Emergency Medicine

## 2015-03-09 DIAGNOSIS — Z8659 Personal history of other mental and behavioral disorders: Secondary | ICD-10-CM | POA: Diagnosis not present

## 2015-03-09 DIAGNOSIS — Z720 Tobacco use: Secondary | ICD-10-CM | POA: Diagnosis not present

## 2015-03-09 DIAGNOSIS — K6289 Other specified diseases of anus and rectum: Secondary | ICD-10-CM | POA: Diagnosis not present

## 2015-03-09 LAB — CBC WITH DIFFERENTIAL/PLATELET
BASOS ABS: 0 10*3/uL (ref 0.0–0.1)
Basophils Relative: 0 % (ref 0–1)
EOS ABS: 0.1 10*3/uL (ref 0.0–0.7)
Eosinophils Relative: 1 % (ref 0–5)
HEMATOCRIT: 41.5 % (ref 39.0–52.0)
HEMOGLOBIN: 14.8 g/dL (ref 13.0–17.0)
Lymphocytes Relative: 32 % (ref 12–46)
Lymphs Abs: 3 10*3/uL (ref 0.7–4.0)
MCH: 32.4 pg (ref 26.0–34.0)
MCHC: 35.7 g/dL (ref 30.0–36.0)
MCV: 90.8 fL (ref 78.0–100.0)
MONOS PCT: 8 % (ref 3–12)
Monocytes Absolute: 0.8 10*3/uL (ref 0.1–1.0)
NEUTROS ABS: 5.5 10*3/uL (ref 1.7–7.7)
NEUTROS PCT: 59 % (ref 43–77)
Platelets: 212 10*3/uL (ref 150–400)
RBC: 4.57 MIL/uL (ref 4.22–5.81)
RDW: 12.7 % (ref 11.5–15.5)
WBC: 9.4 10*3/uL (ref 4.0–10.5)

## 2015-03-09 LAB — COMPREHENSIVE METABOLIC PANEL
ALK PHOS: 66 U/L (ref 38–126)
ALT: 20 U/L (ref 17–63)
ANION GAP: 9 (ref 5–15)
AST: 27 U/L (ref 15–41)
Albumin: 4.5 g/dL (ref 3.5–5.0)
BILIRUBIN TOTAL: 0.8 mg/dL (ref 0.3–1.2)
BUN: 7 mg/dL (ref 6–20)
CALCIUM: 9 mg/dL (ref 8.9–10.3)
CO2: 26 mmol/L (ref 22–32)
Chloride: 104 mmol/L (ref 101–111)
Creatinine, Ser: 0.86 mg/dL (ref 0.61–1.24)
GFR calc Af Amer: >60 mL/min (ref >60)
GFR calc non Af Amer: >60 mL/min (ref >60)
GLUCOSE: 103 mg/dL — AB (ref 65–99)
Potassium: 3.4 mmol/L — ABNORMAL LOW (ref 3.5–5.1)
SODIUM: 139 mmol/L (ref 135–145)
TOTAL PROTEIN: 6.9 g/dL (ref 6.5–8.1)

## 2015-03-09 MED ORDER — DOCUSATE SODIUM 100 MG PO CAPS: 100.0000 mg | ORAL_CAPSULE | Freq: Two times a day (BID) | ORAL | Status: DC

## 2015-03-09 NOTE — ED Notes (Signed)
Pt called again for traige with no response.

## 2015-03-09 NOTE — ED Notes (Signed)
Pt. Called for Triage with no response.

## 2015-03-09 NOTE — ED Notes (Signed)
PA cartner bedside.
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
## 2015-03-09 NOTE — ED Notes (Signed)
This RN at bedside with the PA and patient during rectal exam.  Patient provided complete privacy during exam.

## 2015-03-09 NOTE — Discharge Instructions (Signed)
There is not appear to be an emergent cause for your rectal pain at this time. It is important to follow up with community health and wellness in order to establish primary care and for reevaluation of your symptoms. Please take your Colace as prescribed. He may also try sitz baths to help with your discomfort. Return to ED for any worsening symptoms.

## 2015-03-10 ENCOUNTER — Emergency Department (HOSPITAL_COMMUNITY)
Admission: EM | Admit: 2015-03-10 | Discharge: 2015-03-10 | Disposition: A | Discharge status: Home / Self Care | Payer: BLUE CROSS/BLUE SHIELD | Attending: Emergency Medicine | Admitting: Emergency Medicine

## 2015-03-10 ENCOUNTER — Emergency Department (HOSPITAL_COMMUNITY): Payer: BLUE CROSS/BLUE SHIELD

## 2015-03-10 ENCOUNTER — Encounter (HOSPITAL_COMMUNITY): Payer: Self-pay | Admitting: Emergency Medicine

## 2015-03-10 DIAGNOSIS — R197 Diarrhea, unspecified: Secondary | ICD-10-CM | POA: Diagnosis not present

## 2015-03-10 DIAGNOSIS — Z87828 Personal history of other (healed) physical injury and trauma: Secondary | ICD-10-CM | POA: Diagnosis not present

## 2015-03-10 DIAGNOSIS — F419 Anxiety disorder, unspecified: Secondary | ICD-10-CM | POA: Diagnosis not present

## 2015-03-10 DIAGNOSIS — R1110 Vomiting, unspecified: Secondary | ICD-10-CM | POA: Insufficient documentation

## 2015-03-10 DIAGNOSIS — K6289 Other specified diseases of anus and rectum: Secondary | ICD-10-CM | POA: Insufficient documentation

## 2015-03-10 DIAGNOSIS — Z9049 Acquired absence of other specified parts of digestive tract: Secondary | ICD-10-CM | POA: Diagnosis not present

## 2015-03-10 DIAGNOSIS — G8929 Other chronic pain: Secondary | ICD-10-CM

## 2015-03-10 DIAGNOSIS — Z720 Tobacco use: Secondary | ICD-10-CM | POA: Diagnosis not present

## 2015-03-10 DIAGNOSIS — Z9889 Other specified postprocedural states: Secondary | ICD-10-CM | POA: Diagnosis not present

## 2015-03-10 DIAGNOSIS — F329 Major depressive disorder, single episode, unspecified: Secondary | ICD-10-CM | POA: Diagnosis not present

## 2015-03-10 DIAGNOSIS — K625 Hemorrhage of anus and rectum: Secondary | ICD-10-CM | POA: Diagnosis present

## 2015-03-10 LAB — COMPREHENSIVE METABOLIC PANEL
ALBUMIN: 4.1 g/dL (ref 3.5–5.0)
ALT: 23 U/L (ref 17–63)
ANION GAP: 5 (ref 5–15)
AST: 34 U/L (ref 15–41)
Alkaline Phosphatase: 60 U/L (ref 38–126)
BILIRUBIN TOTAL: 0.5 mg/dL (ref 0.3–1.2)
BUN: 9 mg/dL (ref 6–20)
CO2: 28 mmol/L (ref 22–32)
Calcium: 9 mg/dL (ref 8.9–10.3)
Chloride: 108 mmol/L (ref 101–111)
Creatinine, Ser: 0.78 mg/dL (ref 0.61–1.24)
GFR calc Af Amer: >60 mL/min (ref >60)
Glucose, Bld: 112 mg/dL — ABNORMAL HIGH (ref 65–99)
POTASSIUM: 3.4 mmol/L — AB (ref 3.5–5.1)
Sodium: 141 mmol/L (ref 135–145)
TOTAL PROTEIN: 6.6 g/dL (ref 6.5–8.1)

## 2015-03-10 LAB — CBC WITH DIFFERENTIAL/PLATELET
BASOS ABS: 0 10*3/uL (ref 0.0–0.1)
BASOS PCT: 0 % (ref 0–1)
EOS PCT: 1 % (ref 0–5)
Eosinophils Absolute: 0.1 10*3/uL (ref 0.0–0.7)
HCT: 38.1 % — ABNORMAL LOW (ref 39.0–52.0)
Hemoglobin: 12.9 g/dL — ABNORMAL LOW (ref 13.0–17.0)
Lymphocytes Relative: 23 % (ref 12–46)
Lymphs Abs: 2.1 10*3/uL (ref 0.7–4.0)
MCH: 31.3 pg (ref 26.0–34.0)
MCHC: 33.9 g/dL (ref 30.0–36.0)
MCV: 92.5 fL (ref 78.0–100.0)
MONO ABS: 0.6 10*3/uL (ref 0.1–1.0)
MONOS PCT: 7 % (ref 3–12)
Neutro Abs: 6 10*3/uL (ref 1.7–7.7)
Neutrophils Relative %: 69 % (ref 43–77)
PLATELETS: 176 10*3/uL (ref 150–400)
RBC: 4.12 MIL/uL — ABNORMAL LOW (ref 4.22–5.81)
RDW: 12.6 % (ref 11.5–15.5)
WBC: 8.8 10*3/uL (ref 4.0–10.5)

## 2015-03-10 MED ORDER — TRAMADOL HCL 50 MG PO TABS: 50.0000 mg | ORAL_TABLET | Freq: Four times a day (QID) | ORAL | Status: DC | PRN

## 2015-03-10 MED ORDER — IOHEXOL 300 MG/ML  SOLN
50.0000 mL | Freq: Once | INTRAMUSCULAR | Status: AC | PRN
  Administered 2015-03-10: 50 mL via ORAL

## 2015-03-10 MED ORDER — MORPHINE SULFATE (PF) 4 MG/ML IV SOLN
4.0000 mg | Freq: Once | INTRAVENOUS | Status: AC
  Administered 2015-03-10: 4 mg via INTRAVENOUS
  Filled 2015-03-10: qty 1

## 2015-03-10 MED ORDER — IOHEXOL 300 MG/ML  SOLN
100.0000 mL | Freq: Once | INTRAMUSCULAR | Status: AC | PRN
  Administered 2015-03-10: 100 mL via INTRAVENOUS

## 2015-03-10 NOTE — Discharge Instructions (Signed)
Follow-up with your gastroenterologist.  Chronic Pain Chronic pain can be defined as pain that is off and on and lasts for 3-6 months or longer. Many things cause chronic pain, which can make it difficult to make a diagnosis. There are many treatment options available for chronic pain. However, finding a treatment that works well for you may require trying various approaches until the right one is found. Many people benefit from a combination of two or more types of treatment to control their pain. SYMPTOMS  Chronic pain can occur anywhere in the body and can range from mild to very severe. Some types of chronic pain include:  Headache.  Low back pain.  Cancer pain.  Arthritis pain.  Neurogenic pain. This is pain resulting from damage to nerves. People with chronic pain may also have other symptoms such as:  Depression.  Anger.  Insomnia.  Anxiety. DIAGNOSIS  Your health care provider will help diagnose your condition over time. In many cases, the initial focus will be on excluding possible conditions that could be causing the pain. Depending on your symptoms, your health care provider may order tests to diagnose your condition. Some of these tests may include:   Blood tests.   CT scan.   MRI.   X-rays.   Ultrasounds.   Nerve conduction studies.  You may need to see a specialist.  TREATMENT  Finding treatment that works well may take time. You may be referred to a pain specialist. He or she may prescribe medicine or therapies, such as:   Mindful meditation or yoga.  Shots (injections) of numbing or pain-relieving medicines into the spine or area of pain.  Local electrical stimulation.  Acupuncture.   Massage therapy.   Aroma, color, light, or sound therapy.   Biofeedback.   Working with a physical therapist to keep from getting stiff.   Regular, gentle exercise.   Cognitive or behavioral therapy.   Group support.  Sometimes, surgery may  be recommended.  HOME CARE INSTRUCTIONS   Take all medicines as directed by your health care provider.   Lessen stress in your life by relaxing and doing things such as listening to calming music.   Exercise or be active as directed by your health care provider.   Eat a healthy diet and include things such as vegetables, fruits, fish, and lean meats in your diet.   Keep all follow-up appointments with your health care provider.   Attend a support group with others suffering from chronic pain. SEEK MEDICAL CARE IF:   Your pain gets worse.   You develop a new pain that was not there before.   You cannot tolerate medicines given to you by your health care provider.   You have new symptoms since your last visit with your health care provider.  SEEK IMMEDIATE MEDICAL CARE IF:   You feel weak.   You have decreased sensation or numbness.   You lose control of bowel or bladder function.   Your pain suddenly gets much worse.   You develop shaking.  You develop chills.  You develop confusion.  You develop chest pain.  You develop shortness of breath.  MAKE SURE YOU:  Understand these instructions.  Will watch your condition.  Will get help right away if you are not doing well or get worse. Document Released: 03/25/2002 Document Revised: 03/05/2013 Document Reviewed: 12/27/2012 [facility] Patient Information 2015 [place], [place]. This information is not intended to replace advice given to you by your health care provider.  Make sure you discuss any questions you have with your health care provider. ° °

## 2015-03-10 NOTE — ED Notes (Signed)
Pt states he is having rectal pain and bleeding  Pt states he has been to the hospital for the past 3 nights with same  Pt states his stools are liquidy and have blood in the toilet and on the paper when he wipes and states he has a lot of pain with them  Pt states it feels like something is ripping in his rectum
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
## 2015-03-10 NOTE — ED Notes (Signed)
Pt ambulating independently w/ steady gait on d/c in no acute distress, A&Ox4. D/c instructions reviewed w/ pt - pt denies any further questions or concerns at present. Rx given x1  

## 2015-04-12 ENCOUNTER — Ambulatory Visit (HOSPITAL_COMMUNITY): Payer: Self-pay | Admitting: Psychology

## 2015-04-12 ENCOUNTER — Ambulatory Visit (HOSPITAL_COMMUNITY): Payer: BLUE CROSS/BLUE SHIELD | Admitting: Psychology

## 2015-04-28 ENCOUNTER — Ambulatory Visit (HOSPITAL_COMMUNITY): Payer: BLUE CROSS/BLUE SHIELD | Admitting: Psychology

## 2018-03-10 ENCOUNTER — Emergency Department (HOSPITAL_COMMUNITY)
Admission: EM | Admit: 2018-03-10 | Discharge: 2018-03-10 | Disposition: A | Discharge status: Home / Self Care | Payer: BLUE CROSS/BLUE SHIELD | Attending: Emergency Medicine | Admitting: Emergency Medicine

## 2018-03-10 ENCOUNTER — Other Ambulatory Visit: Payer: Self-pay

## 2018-03-10 DIAGNOSIS — Z79899 Other long term (current) drug therapy: Secondary | ICD-10-CM | POA: Insufficient documentation

## 2018-03-10 DIAGNOSIS — F17210 Nicotine dependence, cigarettes, uncomplicated: Secondary | ICD-10-CM | POA: Insufficient documentation

## 2018-03-10 DIAGNOSIS — L739 Follicular disorder, unspecified: Secondary | ICD-10-CM | POA: Insufficient documentation

## 2018-03-10 MED ORDER — DOXYCYCLINE HYCLATE 100 MG PO CAPS: 100.0000 mg | ORAL_CAPSULE | Freq: Two times a day (BID) | ORAL | 0 refills | Status: DC

## 2018-03-10 MED ORDER — VALACYCLOVIR HCL 1 G PO TABS: 1000.0000 mg | ORAL_TABLET | Freq: Three times a day (TID) | ORAL | 0 refills | Status: DC

## 2018-03-10 MED ORDER — DOXYCYCLINE HYCLATE 100 MG PO TABS
100.0000 mg | ORAL_TABLET | Freq: Once | ORAL | Status: AC
  Administered 2018-03-10: 100 mg via ORAL
  Filled 2018-03-10: qty 1

## 2018-03-10 MED ORDER — VALACYCLOVIR HCL 500 MG PO TABS
500.0000 mg | ORAL_TABLET | Freq: Once | ORAL | Status: AC
  Administered 2018-03-10: 500 mg via ORAL
  Filled 2018-03-10 (×2): qty 1
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
## 2018-03-10 NOTE — Discharge Instructions (Addendum)
Take the antibiotics and antiviral medication as prescribed.  Follow-up for wound check in 2 days with your primary doctor.  Return to the ED with worsening pain, fever, vomiting or other concerns.

## 2018-03-10 NOTE — ED Notes (Signed)
Pt verbalized understanding of d/c instructions, medication administration and return precautions.  Unable to obtain e- signature d/t equipment malfunction.
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
## 2019-01-03 ENCOUNTER — Emergency Department (HOSPITAL_COMMUNITY)
Admission: EM | Admit: 2019-01-03 | Discharge: 2019-01-04 | Disposition: A | Discharge status: Home / Self Care | Payer: Self-pay | Attending: Emergency Medicine | Admitting: Emergency Medicine

## 2019-01-03 ENCOUNTER — Emergency Department (HOSPITAL_COMMUNITY): Payer: Self-pay

## 2019-01-03 ENCOUNTER — Other Ambulatory Visit: Payer: Self-pay

## 2019-01-03 ENCOUNTER — Encounter (HOSPITAL_COMMUNITY): Payer: Self-pay

## 2019-01-03 DIAGNOSIS — R197 Diarrhea, unspecified: Secondary | ICD-10-CM | POA: Insufficient documentation

## 2019-01-03 DIAGNOSIS — R110 Nausea: Secondary | ICD-10-CM | POA: Insufficient documentation

## 2019-01-03 DIAGNOSIS — Z20828 Contact with and (suspected) exposure to other viral communicable diseases: Secondary | ICD-10-CM | POA: Insufficient documentation

## 2019-01-03 DIAGNOSIS — M791 Myalgia, unspecified site: Secondary | ICD-10-CM | POA: Insufficient documentation

## 2019-01-03 DIAGNOSIS — F17210 Nicotine dependence, cigarettes, uncomplicated: Secondary | ICD-10-CM | POA: Insufficient documentation

## 2019-01-03 DIAGNOSIS — R531 Weakness: Secondary | ICD-10-CM | POA: Insufficient documentation

## 2019-01-03 DIAGNOSIS — R1084 Generalized abdominal pain: Secondary | ICD-10-CM | POA: Insufficient documentation

## 2019-01-03 DIAGNOSIS — R05 Cough: Secondary | ICD-10-CM | POA: Insufficient documentation

## 2019-01-03 DIAGNOSIS — R509 Fever, unspecified: Secondary | ICD-10-CM | POA: Insufficient documentation

## 2019-01-03 LAB — CBC WITH DIFFERENTIAL/PLATELET
Abs Immature Granulocytes: 0.02 10*3/uL (ref 0.00–0.07)
Basophils Absolute: 0 10*3/uL (ref 0.0–0.1)
Basophils Relative: 0 %
Eosinophils Absolute: 0 10*3/uL (ref 0.0–0.5)
Eosinophils Relative: 1 %
HCT: 42.3 % (ref 39.0–52.0)
Hemoglobin: 14.2 g/dL (ref 13.0–17.0)
Immature Granulocytes: 0 %
Lymphocytes Relative: 24 %
Lymphs Abs: 1.2 10*3/uL (ref 0.7–4.0)
MCH: 31.6 pg (ref 26.0–34.0)
MCHC: 33.6 g/dL (ref 30.0–36.0)
MCV: 94.2 fL (ref 80.0–100.0)
Monocytes Absolute: 0.5 10*3/uL (ref 0.1–1.0)
Monocytes Relative: 10 %
Neutro Abs: 3.4 10*3/uL (ref 1.7–7.7)
Neutrophils Relative %: 65 %
Platelets: 169 10*3/uL (ref 150–400)
RBC: 4.49 MIL/uL (ref 4.22–5.81)
RDW: 12.8 % (ref 11.5–15.5)
WBC: 5.3 10*3/uL (ref 4.0–10.5)
nRBC: 0 % (ref 0.0–0.2)

## 2019-01-03 LAB — COMPREHENSIVE METABOLIC PANEL
ALT: 36 U/L (ref 0–44)
AST: 37 U/L (ref 15–41)
Albumin: 3.8 g/dL (ref 3.5–5.0)
Alkaline Phosphatase: 67 U/L (ref 38–126)
Anion gap: 10 (ref 5–15)
BUN: 12 mg/dL (ref 6–20)
CO2: 24 mmol/L (ref 22–32)
Calcium: 8.5 mg/dL — ABNORMAL LOW (ref 8.9–10.3)
Chloride: 102 mmol/L (ref 98–111)
Creatinine, Ser: 1.04 mg/dL (ref 0.61–1.24)
GFR calc Af Amer: >60 mL/min (ref >60)
GFR calc non Af Amer: >60 mL/min (ref >60)
Glucose, Bld: 129 mg/dL — ABNORMAL HIGH (ref 70–99)
Potassium: 3.8 mmol/L (ref 3.5–5.1)
Sodium: 136 mmol/L (ref 135–145)
Total Bilirubin: 0.8 mg/dL (ref 0.3–1.2)
Total Protein: 6.3 g/dL — ABNORMAL LOW (ref 6.5–8.1)

## 2019-01-03 LAB — LACTIC ACID, PLASMA: Lactic Acid, Venous: 1.8 mmol/L (ref 0.5–1.9)

## 2019-01-03 LAB — SARS CORONAVIRUS 2 BY RT PCR (HOSPITAL ORDER, PERFORMED IN ~~LOC~~ HOSPITAL LAB): SARS Coronavirus 2: NEGATIVE

## 2019-01-03 MED ORDER — SODIUM CHLORIDE 0.9 % IV BOLUS
1000.0000 mL | Freq: Once | INTRAVENOUS | Status: AC
  Administered 2019-01-03: 1000 mL via INTRAVENOUS

## 2019-01-03 MED ORDER — ACETAMINOPHEN 325 MG PO TABS
650.0000 mg | ORAL_TABLET | Freq: Once | ORAL | Status: AC | PRN
  Administered 2019-01-03: 650 mg via ORAL

## 2019-01-03 MED ORDER — KETOROLAC TROMETHAMINE 30 MG/ML IJ SOLN
30.0000 mg | Freq: Once | INTRAMUSCULAR | Status: AC
  Administered 2019-01-03: 30 mg via INTRAVENOUS
  Filled 2019-01-03: qty 1

## 2019-01-03 MED ORDER — ONDANSETRON HCL 4 MG/2ML IJ SOLN
4.0000 mg | Freq: Once | INTRAMUSCULAR | Status: AC
  Administered 2019-01-03: 4 mg via INTRAVENOUS
  Filled 2019-01-03: qty 2

## 2019-01-03 MED ORDER — IOHEXOL 300 MG/ML  SOLN
100.0000 mL | Freq: Once | INTRAMUSCULAR | Status: AC | PRN
  Administered 2019-01-03: 100 mL via INTRAVENOUS

## 2019-01-03 NOTE — ED Notes (Signed)
Patient transported to CT 
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
## 2019-01-03 NOTE — ED Triage Notes (Signed)
Pt reports generalized weakness, fever and cough for 3 days. Pt reports a highest temp of 104. Not taking anything OTC for symptoms.

## 2019-01-04 LAB — URINALYSIS, ROUTINE W REFLEX MICROSCOPIC
Bacteria, UA: NONE SEEN
Bilirubin Urine: NEGATIVE
Glucose, UA: NEGATIVE mg/dL
Hgb urine dipstick: NEGATIVE
Ketones, ur: NEGATIVE mg/dL
Leukocytes,Ua: NEGATIVE
Nitrite: NEGATIVE
Protein, ur: 30 mg/dL — AB
Specific Gravity, Urine: >1.046 — ABNORMAL HIGH (ref 1.005–1.030)
pH: 6 (ref 5.0–8.0)
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
## 2019-01-04 NOTE — Discharge Instructions (Signed)
You can take Tylenol or Ibuprofen as directed for pain. You can alternate Tylenol and Ibuprofen every 4 hours. If you take Tylenol at 1pm, then you can take Ibuprofen at 5pm. Then you can take Tylenol again at 9pm.   Make sure you are drinking plenty of fluids and staying hydrated.  Follow-up with [facility] to establish a primary care doctor if you do not have one.   Return the emergency department for any persistent fever despite medications, abdominal pain, vomiting or any other worsening or concerning symptoms.

## 2019-01-06 ENCOUNTER — Encounter (HOSPITAL_COMMUNITY): Payer: Self-pay | Admitting: Emergency Medicine

## 2019-01-06 ENCOUNTER — Emergency Department (HOSPITAL_COMMUNITY): Payer: HRSA Program

## 2019-01-06 ENCOUNTER — Other Ambulatory Visit: Payer: Self-pay

## 2019-01-06 ENCOUNTER — Emergency Department (HOSPITAL_COMMUNITY)
Admission: EM | Admit: 2019-01-06 | Discharge: 2019-01-06 | Disposition: A | Discharge status: Home / Self Care | Payer: HRSA Program | Attending: Emergency Medicine | Admitting: Emergency Medicine

## 2019-01-06 DIAGNOSIS — Z20828 Contact with and (suspected) exposure to other viral communicable diseases: Secondary | ICD-10-CM | POA: Diagnosis not present

## 2019-01-06 DIAGNOSIS — E860 Dehydration: Secondary | ICD-10-CM | POA: Diagnosis not present

## 2019-01-06 DIAGNOSIS — J069 Acute upper respiratory infection, unspecified: Secondary | ICD-10-CM | POA: Diagnosis not present

## 2019-01-06 DIAGNOSIS — F17210 Nicotine dependence, cigarettes, uncomplicated: Secondary | ICD-10-CM | POA: Diagnosis not present

## 2019-01-06 DIAGNOSIS — R509 Fever, unspecified: Secondary | ICD-10-CM

## 2019-01-06 LAB — CBC WITH DIFFERENTIAL/PLATELET
Abs Immature Granulocytes: 0.03 10*3/uL (ref 0.00–0.07)
Basophils Absolute: 0 10*3/uL (ref 0.0–0.1)
Basophils Relative: 1 %
Eosinophils Absolute: 0 10*3/uL (ref 0.0–0.5)
Eosinophils Relative: 0 %
HCT: 41.3 % (ref 39.0–52.0)
Hemoglobin: 14.2 g/dL (ref 13.0–17.0)
Immature Granulocytes: 1 %
Lymphocytes Relative: 37 %
Lymphs Abs: 2 10*3/uL (ref 0.7–4.0)
MCH: 31.3 pg (ref 26.0–34.0)
MCHC: 34.4 g/dL (ref 30.0–36.0)
MCV: 91.2 fL (ref 80.0–100.0)
Monocytes Absolute: 0.3 10*3/uL (ref 0.1–1.0)
Monocytes Relative: 5 %
Neutro Abs: 3.1 10*3/uL (ref 1.7–7.7)
Neutrophils Relative %: 56 %
Platelets: 132 10*3/uL — ABNORMAL LOW (ref 150–400)
RBC: 4.53 MIL/uL (ref 4.22–5.81)
RDW: 12.6 % (ref 11.5–15.5)
WBC: 5.5 10*3/uL (ref 4.0–10.5)
nRBC: 0 % (ref 0.0–0.2)

## 2019-01-06 LAB — COMPREHENSIVE METABOLIC PANEL
ALT: 59 U/L — ABNORMAL HIGH (ref 0–44)
AST: 53 U/L — ABNORMAL HIGH (ref 15–41)
Albumin: 3.7 g/dL (ref 3.5–5.0)
Alkaline Phosphatase: 63 U/L (ref 38–126)
Anion gap: 10 (ref 5–15)
BUN: 5 mg/dL — ABNORMAL LOW (ref 6–20)
CO2: 23 mmol/L (ref 22–32)
Calcium: 8.6 mg/dL — ABNORMAL LOW (ref 8.9–10.3)
Chloride: 103 mmol/L (ref 98–111)
Creatinine, Ser: 0.91 mg/dL (ref 0.61–1.24)
GFR calc Af Amer: >60 mL/min (ref >60)
GFR calc non Af Amer: >60 mL/min (ref >60)
Glucose, Bld: 90 mg/dL (ref 70–99)
Potassium: 4 mmol/L (ref 3.5–5.1)
Sodium: 136 mmol/L (ref 135–145)
Total Bilirubin: 0.8 mg/dL (ref 0.3–1.2)
Total Protein: 6.5 g/dL (ref 6.5–8.1)

## 2019-01-06 LAB — URINALYSIS, ROUTINE W REFLEX MICROSCOPIC
Bilirubin Urine: NEGATIVE
Glucose, UA: NEGATIVE mg/dL
Hgb urine dipstick: NEGATIVE
Ketones, ur: 5 mg/dL — AB
Leukocytes,Ua: NEGATIVE
Nitrite: NEGATIVE
Protein, ur: NEGATIVE mg/dL
Specific Gravity, Urine: 1.018 (ref 1.005–1.030)
pH: 6 (ref 5.0–8.0)

## 2019-01-06 LAB — ACETAMINOPHEN LEVEL: Acetaminophen (Tylenol), Serum: <10 ug/mL — ABNORMAL LOW (ref 10–30)

## 2019-01-06 LAB — GROUP A STREP BY PCR: Group A Strep by PCR: NOT DETECTED

## 2019-01-06 MED ORDER — IBUPROFEN 400 MG PO TABS
600.0000 mg | ORAL_TABLET | Freq: Once | ORAL | Status: AC
  Administered 2019-01-06: 12:00:00 600 mg via ORAL
  Filled 2019-01-06: qty 1

## 2019-01-06 MED ORDER — SODIUM CHLORIDE 0.9 % IV BOLUS
1000.0000 mL | Freq: Once | INTRAVENOUS | Status: AC
  Administered 2019-01-06: 1000 mL via INTRAVENOUS

## 2019-01-06 NOTE — ED Notes (Signed)
X-ray at bedside

## 2019-01-06 NOTE — Discharge Instructions (Signed)
Home to rest. Take Motrin and Tylenol as needed as directed for fever. Push hydrating fluids.

## 2019-01-06 NOTE — ED Triage Notes (Signed)
Pt. Stated, I was here on Friday and was tested for COVID and neg, told to take tylenol, and I have but zi feel fatigue and not able to eat or nothing.  Last tylenol last night 2 pills.
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
## 2019-01-07 ENCOUNTER — Emergency Department (HOSPITAL_COMMUNITY)
Admission: EM | Admit: 2019-01-07 | Discharge: 2019-01-07 | Disposition: A | Discharge status: Home / Self Care | Payer: Self-pay | Attending: Emergency Medicine | Admitting: Emergency Medicine

## 2019-01-07 ENCOUNTER — Encounter (HOSPITAL_COMMUNITY): Payer: Self-pay | Admitting: Emergency Medicine

## 2019-01-07 ENCOUNTER — Other Ambulatory Visit: Payer: Self-pay

## 2019-01-07 DIAGNOSIS — B349 Viral infection, unspecified: Secondary | ICD-10-CM

## 2019-01-07 DIAGNOSIS — B2790 Infectious mononucleosis, unspecified without complication: Secondary | ICD-10-CM

## 2019-01-07 LAB — MONONUCLEOSIS SCREEN: Mono Screen: POSITIVE — AB

## 2019-01-07 LAB — NOVEL CORONAVIRUS, NAA (HOSP ORDER, SEND-OUT TO REF LAB; TAT 18-24 HRS): SARS-CoV-2, NAA: NOT DETECTED

## 2019-01-07 MED ORDER — ONDANSETRON HCL 4 MG/2ML IJ SOLN
4.0000 mg | Freq: Once | INTRAMUSCULAR | Status: AC
  Administered 2019-01-07: 20:00:00 4 mg via INTRAVENOUS
  Filled 2019-01-07: qty 2

## 2019-01-07 MED ORDER — KETOROLAC TROMETHAMINE 15 MG/ML IJ SOLN
15.0000 mg | Freq: Once | INTRAMUSCULAR | Status: AC
  Administered 2019-01-07: 20:00:00 15 mg via INTRAVENOUS
  Filled 2019-01-07: qty 1

## 2019-01-07 MED ORDER — ACETAMINOPHEN 325 MG PO TABS
650.0000 mg | ORAL_TABLET | Freq: Once | ORAL | Status: AC
  Administered 2019-01-07: 650 mg via ORAL
  Filled 2019-01-07: qty 2

## 2019-01-07 MED ORDER — SODIUM CHLORIDE 0.9 % IV BOLUS
1000.0000 mL | Freq: Once | INTRAVENOUS | Status: AC
  Administered 2019-01-07: 1000 mL via INTRAVENOUS
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
## 2019-01-07 NOTE — ED Provider Notes (Signed)
Assumed care from previous provider. He has had a fever since Thursday. This is his 3rd ED visit. Mono was sent and is positive. Fever has resolved with Tylenol. Had a long discussion about Mono with patient and his mother. He is very upset and discouraged that there is no specific treatment. He was provided reassurance and given return precautions.   Recardo Evangelist, PA-C 01/07/19 2253    Charlesetta Shanks, MD 01/16/19 [phone]

## 2019-01-07 NOTE — ED Notes (Signed)
[phone] pts mother

## 2019-01-07 NOTE — Discharge Instructions (Addendum)
Please read attached information. If you experience any new or worsening signs or symptoms please return to the emergency room for evaluation. Please follow-up with your primary care provider or specialist as discussed.   Take Tylenol every 4 hours for fever. You can alternate Ibuprofen 600mg  every 6 hours

## 2019-01-07 NOTE — ED Triage Notes (Signed)
Pt arrives to eD with same complaint as before- fever chills body aches- pt has had two negative covid tests-

## 2019-01-08 LAB — CULTURE, BLOOD (ROUTINE X 2)
Culture: NO GROWTH
Culture: NO GROWTH
Special Requests: ADEQUATE
Special Requests: ADEQUATE
